# Patient Record
Sex: Male | Born: 1955 | Race: Black or African American | Hispanic: No | Marital: Single | Smoking: Former smoker
Health system: Southern US, Community
[De-identification: ages and names within clinical notes are randomized; demographics above are authoritative.]

## PROBLEM LIST (undated history)

## (undated) DIAGNOSIS — E785 Hyperlipidemia, unspecified: Secondary | ICD-10-CM

## (undated) DIAGNOSIS — R51 Headache: Secondary | ICD-10-CM

## (undated) DIAGNOSIS — I10 Essential (primary) hypertension: Secondary | ICD-10-CM

## (undated) DIAGNOSIS — F329 Major depressive disorder, single episode, unspecified: Secondary | ICD-10-CM

## (undated) DIAGNOSIS — F32A Depression, unspecified: Secondary | ICD-10-CM

## (undated) DIAGNOSIS — F419 Anxiety disorder, unspecified: Secondary | ICD-10-CM

## (undated) DIAGNOSIS — K759 Inflammatory liver disease, unspecified: Secondary | ICD-10-CM

## (undated) DIAGNOSIS — R7303 Prediabetes: Secondary | ICD-10-CM

## (undated) DIAGNOSIS — A549 Gonococcal infection, unspecified: Secondary | ICD-10-CM

## (undated) HISTORY — PX: TONSILLECTOMY: SUR1361

## (undated) HISTORY — PX: SPINAL FIXATION SURGERY: SHX1055

## (undated) HISTORY — DX: Gonococcal infection, unspecified: A54.9

## (undated) HISTORY — DX: Essential (primary) hypertension: I10

## (undated) HISTORY — DX: Headache: R51

## (undated) HISTORY — DX: Hyperlipidemia, unspecified: E78.5

---
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
## 2000-05-27 ENCOUNTER — Observation Stay (HOSPITAL_COMMUNITY): Admission: EM | Admit: 2000-05-27 | Discharge: 2000-05-28 | Payer: Self-pay | Admitting: Emergency Medicine

## 2000-05-27 ENCOUNTER — Encounter: Payer: Self-pay | Admitting: Emergency Medicine

## 2000-05-27 ENCOUNTER — Encounter: Payer: Self-pay | Admitting: Internal Medicine

## 2001-06-20 ENCOUNTER — Emergency Department (HOSPITAL_COMMUNITY): Admission: EM | Admit: 2001-06-20 | Discharge: 2001-06-20 | Payer: Self-pay | Admitting: Emergency Medicine

## 2005-01-28 ENCOUNTER — Emergency Department (HOSPITAL_COMMUNITY): Admission: EM | Admit: 2005-01-28 | Discharge: 2005-01-28 | Payer: Self-pay | Admitting: Emergency Medicine

## 2005-11-04 ENCOUNTER — Ambulatory Visit: Payer: Self-pay | Admitting: Internal Medicine

## 2005-11-10 ENCOUNTER — Ambulatory Visit: Payer: Self-pay | Admitting: Internal Medicine

## 2006-01-21 ENCOUNTER — Ambulatory Visit: Payer: Self-pay | Admitting: Internal Medicine

## 2006-01-21 ENCOUNTER — Ambulatory Visit (HOSPITAL_COMMUNITY): Admission: RE | Admit: 2006-01-21 | Discharge: 2006-01-21 | Payer: Self-pay | Admitting: Internal Medicine

## 2006-02-09 ENCOUNTER — Emergency Department (HOSPITAL_COMMUNITY): Admission: EM | Admit: 2006-02-09 | Discharge: 2006-02-09 | Payer: Self-pay | Admitting: *Deleted

## 2006-04-28 ENCOUNTER — Emergency Department (HOSPITAL_COMMUNITY): Admission: EM | Admit: 2006-04-28 | Discharge: 2006-04-28 | Payer: Self-pay | Admitting: Family Medicine

## 2006-06-25 ENCOUNTER — Emergency Department (HOSPITAL_COMMUNITY): Admission: EM | Admit: 2006-06-25 | Discharge: 2006-06-25 | Payer: Self-pay | Admitting: Emergency Medicine

## 2006-09-07 ENCOUNTER — Emergency Department (HOSPITAL_COMMUNITY): Admission: EM | Admit: 2006-09-07 | Discharge: 2006-09-07 | Payer: Self-pay | Admitting: Family Medicine

## 2006-09-10 ENCOUNTER — Ambulatory Visit: Payer: Self-pay | Admitting: Hospitalist

## 2006-09-10 ENCOUNTER — Encounter (INDEPENDENT_AMBULATORY_CARE_PROVIDER_SITE_OTHER): Payer: Self-pay | Admitting: *Deleted

## 2006-09-10 ENCOUNTER — Ambulatory Visit (HOSPITAL_COMMUNITY): Admission: RE | Admit: 2006-09-10 | Discharge: 2006-09-10 | Payer: Self-pay | Admitting: Hospitalist

## 2006-09-10 ENCOUNTER — Encounter (INDEPENDENT_AMBULATORY_CARE_PROVIDER_SITE_OTHER): Payer: Self-pay | Admitting: Internal Medicine

## 2006-09-10 DIAGNOSIS — I10 Essential (primary) hypertension: Secondary | ICD-10-CM | POA: Insufficient documentation

## 2006-09-10 LAB — CONVERTED CEMR LAB
Amphetamine Screen, Ur: NEGATIVE
Barbiturate Quant, Ur: NEGATIVE
Benzodiazepines.: NEGATIVE
Bilirubin Urine: NEGATIVE
Cocaine Metabolites: NEGATIVE
Creatinine,U: 105.5 mg/dL
Hemoglobin, Urine: NEGATIVE
Ketones, ur: NEGATIVE mg/dL
Leukocytes, UA: NEGATIVE
Marijuana Metabolite: NEGATIVE
Methadone: NEGATIVE
Nitrite: NEGATIVE
Opiates: NEGATIVE
Phencyclidine (PCP): NEGATIVE
Propoxyphene: NEGATIVE
Protein, ur: NEGATIVE mg/dL
Specific Gravity, Urine: 1.014 (ref 1.005–1.03)
Urine Glucose: NEGATIVE mg/dL
Urobilinogen, UA: 1 (ref 0.0–1.0)
pH: 6.5 (ref 5.0–8.0)

## 2006-09-11 ENCOUNTER — Encounter (INDEPENDENT_AMBULATORY_CARE_PROVIDER_SITE_OTHER): Payer: Self-pay | Admitting: *Deleted

## 2006-09-11 ENCOUNTER — Ambulatory Visit: Payer: Self-pay | Admitting: Internal Medicine

## 2006-09-11 LAB — CONVERTED CEMR LAB
ALT: 27 units/L (ref 0–53)
AST: 23 units/L (ref 0–37)
Albumin: 4.4 g/dL (ref 3.5–5.2)
Alkaline Phosphatase: 56 units/L (ref 39–117)
BUN: 10 mg/dL (ref 6–23)
CO2: 29 meq/L (ref 19–32)
Calcium: 9.7 mg/dL (ref 8.4–10.5)
Chloride: 102 meq/L (ref 96–112)
Cholesterol: 141 mg/dL (ref 0–200)
Creatinine, Ser: 0.94 mg/dL (ref 0.40–1.50)
Glucose, Bld: 118 mg/dL — ABNORMAL HIGH (ref 70–99)
HDL: 36 mg/dL — ABNORMAL LOW (ref >39)
LDL Cholesterol: 72 mg/dL (ref 0–99)
Potassium: 4.7 meq/L (ref 3.5–5.3)
Sodium: 141 meq/L (ref 135–145)
Total Bilirubin: 0.7 mg/dL (ref 0.3–1.2)
Total CHOL/HDL Ratio: 3.9
Total Protein: 7.3 g/dL (ref 6.0–8.3)
Triglycerides: 167 mg/dL — ABNORMAL HIGH (ref <150)
VLDL: 33 mg/dL (ref 0–40)

## 2006-09-24 ENCOUNTER — Ambulatory Visit: Payer: Self-pay | Admitting: Internal Medicine

## 2006-09-24 ENCOUNTER — Encounter (INDEPENDENT_AMBULATORY_CARE_PROVIDER_SITE_OTHER): Payer: Self-pay | Admitting: *Deleted

## 2006-09-24 DIAGNOSIS — J301 Allergic rhinitis due to pollen: Secondary | ICD-10-CM | POA: Insufficient documentation

## 2006-09-24 DIAGNOSIS — E785 Hyperlipidemia, unspecified: Secondary | ICD-10-CM | POA: Insufficient documentation

## 2006-10-24 ENCOUNTER — Emergency Department (HOSPITAL_COMMUNITY): Admission: EM | Admit: 2006-10-24 | Discharge: 2006-10-24 | Payer: Self-pay | Admitting: Family Medicine

## 2007-01-22 ENCOUNTER — Emergency Department (HOSPITAL_COMMUNITY): Admission: EM | Admit: 2007-01-22 | Discharge: 2007-01-22 | Payer: Self-pay | Admitting: Family Medicine

## 2007-02-01 ENCOUNTER — Emergency Department (HOSPITAL_COMMUNITY): Admission: EM | Admit: 2007-02-01 | Discharge: 2007-02-01 | Payer: Self-pay | Admitting: Emergency Medicine

## 2007-07-17 ENCOUNTER — Emergency Department (HOSPITAL_COMMUNITY): Admission: EM | Admit: 2007-07-17 | Discharge: 2007-07-17 | Payer: Self-pay | Admitting: Family Medicine

## 2007-07-29 ENCOUNTER — Telehealth (INDEPENDENT_AMBULATORY_CARE_PROVIDER_SITE_OTHER): Payer: Self-pay | Admitting: *Deleted

## 2007-12-13 ENCOUNTER — Emergency Department (HOSPITAL_COMMUNITY): Admission: EM | Admit: 2007-12-13 | Discharge: 2007-12-13 | Payer: Self-pay | Admitting: Family Medicine

## 2008-02-09 ENCOUNTER — Ambulatory Visit: Payer: Self-pay | Admitting: Internal Medicine

## 2008-02-09 ENCOUNTER — Encounter (INDEPENDENT_AMBULATORY_CARE_PROVIDER_SITE_OTHER): Payer: Self-pay | Admitting: Internal Medicine

## 2008-02-09 LAB — CONVERTED CEMR LAB
ALT: 28 units/L (ref 0–53)
AST: 32 units/L (ref 0–37)
Albumin: 4.3 g/dL (ref 3.5–5.2)
Alkaline Phosphatase: 47 units/L (ref 39–117)
BUN: 20 mg/dL (ref 6–23)
Basophils Absolute: 0 10*3/uL (ref 0.0–0.1)
Basophils Relative: 0 % (ref 0–1)
CO2: 23 meq/L (ref 19–32)
Calcium: 8.8 mg/dL (ref 8.4–10.5)
Chloride: 101 meq/L (ref 96–112)
Creatinine, Ser: 1.18 mg/dL (ref 0.40–1.50)
Eosinophils Absolute: 0.1 10*3/uL (ref 0.0–0.7)
Eosinophils Relative: 1 % (ref 0–5)
Glucose, Bld: 140 mg/dL — ABNORMAL HIGH (ref 70–99)
HCT: 41.8 % (ref 39.0–52.0)
Hemoglobin: 15.2 g/dL (ref 13.0–17.0)
Lymphocytes Relative: 29 % (ref 12–46)
Lymphs Abs: 2.6 10*3/uL (ref 0.7–4.0)
MCHC: 36.4 g/dL — ABNORMAL HIGH (ref 30.0–36.0)
MCV: 84.1 fL (ref 78.0–100.0)
Monocytes Absolute: 0.8 10*3/uL (ref 0.1–1.0)
Monocytes Relative: 9 % (ref 3–12)
Neutro Abs: 5.3 10*3/uL (ref 1.7–7.7)
Neutrophils Relative %: 61 % (ref 43–77)
Platelets: 232 10*3/uL (ref 150–400)
Potassium: 3.6 meq/L (ref 3.5–5.3)
RBC: 4.97 M/uL (ref 4.22–5.81)
RDW: 12.1 % (ref 11.5–15.5)
Sodium: 138 meq/L (ref 135–145)
TSH: 0.726 microintl units/mL (ref 0.350–4.50)
Total Bilirubin: 1.2 mg/dL (ref 0.3–1.2)
Total Protein: 6.9 g/dL (ref 6.0–8.3)
WBC: 8.8 10*3/uL (ref 4.0–10.5)

## 2008-12-01 ENCOUNTER — Ambulatory Visit: Payer: Self-pay | Admitting: *Deleted

## 2008-12-01 ENCOUNTER — Inpatient Hospital Stay (HOSPITAL_COMMUNITY): Admission: RE | Admit: 2008-12-01 | Discharge: 2008-12-06 | Payer: Self-pay | Admitting: *Deleted

## 2008-12-01 ENCOUNTER — Emergency Department (HOSPITAL_COMMUNITY): Admission: EM | Admit: 2008-12-01 | Discharge: 2008-12-01 | Payer: Self-pay | Admitting: Emergency Medicine

## 2009-07-27 ENCOUNTER — Emergency Department (HOSPITAL_COMMUNITY): Admission: EM | Admit: 2009-07-27 | Discharge: 2009-07-27 | Payer: Self-pay | Admitting: Emergency Medicine

## 2009-08-02 ENCOUNTER — Ambulatory Visit: Payer: Self-pay | Admitting: Internal Medicine

## 2009-08-02 DIAGNOSIS — N400 Enlarged prostate without lower urinary tract symptoms: Secondary | ICD-10-CM | POA: Insufficient documentation

## 2009-08-02 DIAGNOSIS — Z87898 Personal history of other specified conditions: Secondary | ICD-10-CM | POA: Insufficient documentation

## 2009-08-02 HISTORY — DX: Benign prostatic hyperplasia without lower urinary tract symptoms: N40.0

## 2009-08-02 LAB — CONVERTED CEMR LAB
ALT: 35 units/L (ref 0–53)
AST: 41 units/L — ABNORMAL HIGH (ref 0–37)
Albumin: 4.3 g/dL (ref 3.5–5.2)
CO2: 30 meq/L (ref 19–32)
Calcium: 9.1 mg/dL (ref 8.4–10.5)
Chloride: 102 meq/L (ref 96–112)
Cholesterol: 126 mg/dL (ref 0–200)
PSA: 0.59 ng/mL (ref 0.10–4.00)
Potassium: 4.5 meq/L (ref 3.5–5.3)
Total CHOL/HDL Ratio: 2.2
Total Protein: 6.7 g/dL (ref 6.0–8.3)
Vitamin B-12: 441 pg/mL (ref 211–911)

## 2009-12-01 ENCOUNTER — Emergency Department (HOSPITAL_COMMUNITY): Admission: EM | Admit: 2009-12-01 | Discharge: 2009-12-01 | Payer: Self-pay | Admitting: Family Medicine

## 2009-12-03 ENCOUNTER — Encounter: Payer: Self-pay | Admitting: Internal Medicine

## 2009-12-03 ENCOUNTER — Telehealth: Payer: Self-pay | Admitting: Internal Medicine

## 2009-12-07 ENCOUNTER — Encounter (INDEPENDENT_AMBULATORY_CARE_PROVIDER_SITE_OTHER): Payer: Self-pay | Admitting: *Deleted

## 2009-12-12 ENCOUNTER — Ambulatory Visit: Payer: Self-pay | Admitting: Internal Medicine

## 2009-12-12 DIAGNOSIS — F1910 Other psychoactive substance abuse, uncomplicated: Secondary | ICD-10-CM | POA: Insufficient documentation

## 2010-01-04 ENCOUNTER — Encounter (INDEPENDENT_AMBULATORY_CARE_PROVIDER_SITE_OTHER): Payer: Self-pay | Admitting: *Deleted

## 2010-01-04 ENCOUNTER — Ambulatory Visit: Payer: Self-pay | Admitting: Gastroenterology

## 2010-01-07 ENCOUNTER — Encounter: Payer: Self-pay | Admitting: Internal Medicine

## 2010-01-08 ENCOUNTER — Telehealth: Payer: Self-pay | Admitting: Licensed Clinical Social Worker

## 2010-02-07 ENCOUNTER — Ambulatory Visit: Payer: Self-pay | Admitting: Internal Medicine

## 2010-02-14 ENCOUNTER — Telehealth: Payer: Self-pay | Admitting: Gastroenterology

## 2010-02-27 ENCOUNTER — Telehealth: Payer: Self-pay | Admitting: Licensed Clinical Social Worker

## 2010-05-10 ENCOUNTER — Ambulatory Visit: Payer: Self-pay | Admitting: Internal Medicine

## 2010-05-13 ENCOUNTER — Emergency Department (HOSPITAL_COMMUNITY)
Admission: EM | Admit: 2010-05-13 | Discharge: 2010-05-14 | Payer: Self-pay | Source: Home / Self Care | Admitting: Emergency Medicine

## 2010-07-09 NOTE — Miscellaneous (Signed)
Summary: [facility] Previsit/prep  Clinical Lists Changes  Medications: Added new medication of DULCOLAX 5 MG  TBEC (BISACODYL) Day before procedure take 2 at 3pm and 2 at 8pm. - Signed Added new medication of METOCLOPRAMIDE HCL 10 MG  TABS (METOCLOPRAMIDE HCL) As per prep instructions. - Signed Added new medication of MIRALAX   POWD (POLYETHYLENE GLYCOL 3350) As per prep  instructions. - Signed Rx of DULCOLAX 5 MG  TBEC (BISACODYL) Day before procedure take 2 at 3pm and 2 at 8pm.;  #4 x 0;  Signed;  Entered by: Wyona Almas RN;  Authorized by: Louis Meckel MD;  Method used: Electronically to [facility] [address] [place], [place], [place]  [address], Ph: [phone], Fax: [phone] Rx of METOCLOPRAMIDE HCL 10 MG  TABS (METOCLOPRAMIDE HCL) As per prep instructions.;  #2 x 0;  Signed;  Entered by: Wyona Almas RN;  Authorized by: Louis Meckel MD;  Method used: Electronically to [facility] [address], [place], [place], [place]  [address], Ph: [phone], Fax: [phone] Rx of MIRALAX   POWD (POLYETHYLENE GLYCOL 3350) As per prep  instructions.;  #255gm x 0;  Signed;  Entered by: Wyona Almas RN;  Authorized by: Louis Meckel MD;  Method used: Electronically to [facility] [address] [place], [place], [place]  [address], Ph: [phone], Fax: [phone] Observations: Added new observation of NKA: T (01/04/2010 8:35)    Prescriptions: MIRALAX   POWD (POLYETHYLENE GLYCOL 3350) As per prep  instructions.  #255gm x 0   Entered by:   Wyona Almas RN   Authorized by:   Louis Meckel MD   Signed by:   Wyona Almas RN on 01/04/2010   Method used:   Electronically to        [facility] (retail)       [address]       [place], [place]  [address]       Ph: [phone]       Fax: [phone]   RxID:   [id number] METOCLOPRAMIDE HCL 10 MG  TABS (METOCLOPRAMIDE HCL) As per prep  instructions.  #2 x 0   Entered by:   Wyona Almas RN   Authorized by:   Louis Meckel MD   Signed by:   Wyona Almas RN on 01/04/2010   Method used:   Electronically to        [facility] (retail)       [address]       [place], [place]  [address]       Ph: [phone]       Fax: [phone]   RxID:   [id number] DULCOLAX 5 MG  TBEC (BISACODYL) Day before procedure take 2 at 3pm and 2 at 8pm.  #4 x 0   Entered by:   Wyona Almas RN   Authorized by:   Louis Meckel MD   Signed by:   Wyona Almas RN on 01/04/2010   Method used:   Electronically to        [facility] (retail)       [address]       [place], [place]  [address]       Ph: [phone]  Fax: [phone]   RxID:   [id number]

## 2010-07-09 NOTE — Assessment & Plan Note (Signed)
Summary: ACUTE-LOSING WEIGHT/NO ENERGY/PROBLEMS URINATING/CFB   Vital Signs:  Patient profile:   55 year old male Height:      71.25 inches (180.97 cm) Weight:      149.1 pounds (67.77 kg) BMI:     20.72 Temp:     98.7 degrees F oral Pulse rate:   74 / minute BP sitting:   139 / 89  (right arm) Cuff size:   regular  Vitals Entered By: Chinita Pester RN (May 10, 2010 2:06 PM)  CC: Wants to detox off Heroin; wants to go to detox facility.   Is Patient Diabetic? No Pain Assessment Patient in pain? no      Nutritional Status BMI of 19 -24 = normal  Have you ever been in a relationship where you felt threatened, hurt or afraid?Unable to ask; someone w/pt.   Does patient need assistance? Functional Status Self care Ambulation Normal   CC:  Wants to detox off Heroin; wants to go to detox facility.  .  History of Present Illness: Patient is here to "b/c wants to be admitted for an inpatient detox program." Patient admits to "shooting" heroine on 05/09/2010. Denies any other concerns.  Depression History:      The patient denies a depressed mood most of the day and a diminished interest in his usual daily activities.         Preventive Screening-Counseling & Management  Alcohol-Tobacco     Alcohol drinks/day: 0     Smoking Status: quit     Year Quit: 1990  Caffeine-Diet-Exercise     Does Patient Exercise: yes     Type of exercise: GYM     Times/week: 2  Comments: Heroin; last time used was last night.  Allergies (verified): No Known Drug Allergies  Past History:  Past medical, surgical, family and social histories (including risk factors) reviewed for relevance to current acute and chronic problems.  Past Medical History: Reviewed history from 09/24/2006 and no changes required. Chest pain Headache Hypertension +PPD in the 1990's Remote h/o gonorrhea which was treated as per patient Hyperlipidemia     lipitor 10 (April 2008)  Past Surgical  History: Reviewed history from 09/10/2006 and no changes required. Tonsilectomy 1959  Family History: Reviewed history from 12/12/2009 and no changes required. DM no CAD no Cancer  Social History: Reviewed history from 12/12/2009 and no changes required. 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  Review of Systems  The patient denies anorexia, fever, weight loss, weight gain, vision loss, decreased hearing, hoarseness, chest pain, syncope, dyspnea on exertion, peripheral edema, prolonged cough, headaches, hemoptysis, abdominal pain, melena, hematochezia, severe indigestion/heartburn, hematuria, incontinence, genital sores, muscle weakness, suspicious skin lesions, transient blindness, difficulty walking, depression, unusual weight change, abnormal bleeding, enlarged lymph nodes, angioedema, and testicular masses.    Physical Exam  General:  Well-developed,well-nourished,in no acute distress; alert,appropriate and cooperative throughout examination Head:  Normocephalic and atraumatic without obvious abnormalities. No apparent alopecia or balding. Eyes:  vision grossly intact.   Nose:  no external deformity.   Mouth:  poor dentition and teeth missing.  Incisor #24 with severe caries and frx-ed. Neck:  supple and no masses.   Lungs:  Normal respiratory effort, chest expands symmetrically. Lungs are clear to auscultation, no crackles or wheezes. Heart:  Normal rate and regular rhythm. S1 and S2 normal without gallop, murmur, click, rub  or other extra sounds. Abdomen:  Bowel sounds positive,abdomen soft and non-tender without masses, organomegaly or hernias noted. Msk:  normal ROM, no joint tenderness, no joint swelling, no joint warmth, and no redness over joints.   Pulses:  Phalen's test negative bilaterally. Extremities:  No clubbing, cyanosis, edema, or deformity  noted with normal full range of motion of all joints.   Neurologic:  alert & oriented X3, cranial nerves II-XII intact, and gait normal.   Skin:  turgor normal, color normal, and no rashes.   Cervical Nodes:  no anterior cervical adenopathy and no posterior cervical adenopathy.   Psych:  Oriented X3, memory intact for recent and remote, normally interactive, good eye contact, not anxious appearing, and not depressed appearing.     Impression & Recommendations:  Problem # 1:  SUBSTANCE ABUSE, MULTIPLE (ICD-305.90) Assessment Unchanged  Patient is medically cleared for an inpatient detox program. Patient had a meeting with Georges Mouse. He is to be enrolled through a [facility] center.  No charge fro this OV.  Complete Medication List: 1)  Terazosin Hcl 1 Mg Caps (Terazosin hcl) .... Take 1 capsule by mouth 40 minutes before bedtime 2)  Tramadol Hcl 50 Mg Tabs (Tramadol hcl) .... Take 1 tablet by mouth every 8 hours as needed for pain. 3)  Clindamycin Hcl 150 Mg Caps (Clindamycin hcl) .... One tab by mouth two times a day for 7 days  Other Orders: No Charge Patient Arrived (NCPA0) (NCPA0)   Orders Added: 1)  Est. Patient Level III [45409] 2)  Est. Patient Level III [81191] 3)  No Charge Patient Arrived (NCPA0) [NCPA0]    Prevention & Chronic Care Immunizations   Influenza vaccine: Not documented   Influenza vaccine deferral: Refused  (02/07/2010)    Tetanus booster: Not documented   Td booster deferral: Refused  (02/07/2010)    Pneumococcal vaccine: Not documented   Pneumococcal vaccine deferral: Deferred  (08/02/2009)  Colorectal Screening   Hemoccult: Not documented    Colonoscopy: Not documented   Colonoscopy action/deferral: Deferred  (02/07/2010)  Other Screening   PSA: 0.59  (08/02/2009)   Smoking status: quit  (05/10/2010)  Lipids   Total Cholesterol: 126  (08/02/2009)   LDL: 55  (08/02/2009)   LDL Direct: Not documented   HDL: 57   (08/02/2009)   Triglycerides: 69  (08/02/2009)   Lipid panel due: 08/02/2010    SGOT (AST): 41  (08/02/2009)   SGPT (ALT): 35  (08/02/2009)   Alkaline phosphatase: 69  (08/02/2009)   Total bilirubin: 0.9  (08/02/2009)   Liver panel due: 08/02/2010  Hypertension   Last Blood Pressure: 139 / 89  (05/10/2010)   Serum creatinine: 1.03  (08/02/2009)   Serum potassium 4.5  (08/02/2009)   Basic metabolic panel due: 08/02/2010  Self-Management Support :   Personal Goals (by the next clinic visit) :      Personal blood pressure goal: 140/90  (08/02/2009)     Personal LDL goal: 100  (08/02/2009)    Hypertension self-management support: Education handout, Psychologist, forensic, Resources for patients handout  (02/07/2010)    Lipid self-management support: Written self-care plan, Education handout, Pre-printed educational material, Resources for patients handout  (02/07/2010)   Process Orders Tests Sent for requisitioning (May 10, 2010 2:28 PM):     05/10/2010: [facility] -- T-CMP with Estimated GFR [[id number]] (unsigned)     05/10/2010: [facility] -- T-CBC No Diff [[id number]] (unsigned)     05/10/2010: [facility] --  T-TSH [[id number]] (unsigned)

## 2010-07-09 NOTE — Letter (Signed)
Summary: Previsit letter  [facility]  [address] [place], [place] [address]   Phone: [phone]  Fax: [phone]       12/07/2009 MRN: [id number]  Sean Shannon [address] [place], [place]  [address]  Dear [name],  Welcome to the Gastroenterology Division at [facility].    You are scheduled to see a nurse for your pre-procedure visit on 12-31-09 at 3:30p.m. on the 3rd floor at [facility], [address]  We ask that you try to arrive at our office 15 minutes prior to your appointment time to allow for check-in.  Your nurse visit will consist of discussing your medical and surgical history, your immediate family medical history, and your medications.    Please bring a complete list of all your medications or, if you prefer, bring the medication bottles and we will list them.  We will need to be aware of both prescribed and over the counter drugs.  We will need to know exact dosage information as well.  If you are on blood thinners (Coumadin, Plavix, Aggrenox, Ticlid, etc.) please call our office today/prior to your appointment, as we need to consult with your physician about holding your medication.   Please be prepared to read and sign documents such as consent forms, a financial agreement, and acknowledgement forms.  If necessary, and with your consent, a friend or relative is welcome to sit-in on the nurse visit with you.  Please bring your insurance card so that we may make a copy of it.  If your insurance requires a referral to see a specialist, please bring your referral form from your primary care physician.  No co-pay is required for this nurse visit.     If you cannot keep your appointment, please call [phone] to cancel or reschedule prior to your appointment date.  This allows [place] the opportunity to schedule an appointment for another patient in need of care.    Thank you for choosing [facility] for your medical  needs.  We appreciate the opportunity to care for you.  Please visit [place] at our website  to learn more about our practice.                     Sincerely.                                                                                                                   The Gastroenterology Division

## 2010-07-09 NOTE — Assessment & Plan Note (Signed)
Summary: acute-er/fu-(unassigned)/cfb   Vital Signs:  Patient profile:   55 year old male Height:      71.25 inches (180.97 cm) Weight:      147.5 pounds (67.05 kg) BMI:     20.50 Temp:     98.0 degrees F (36.67 degrees C) oral Pulse rate:   60 / minute BP sitting:   119 / 77  (right arm)  Vitals Entered By: Stanton Kidney Ditzler RN (August 02, 2009 4:54 PM) Is Patient Diabetic? No Pain Assessment Patient in pain? yes     Location: fingers Intensity: 5 Type: tingling Onset of pain  past 2-3 days Nutritional Status BMI of 19 -24 = normal Nutritional Status Detail appetite good  Have you ever been in a relationship where you felt threatened, hurt or afraid?denies   Does patient need assistance? Functional Status Self care Ambulation Normal Comments Wife with pt. Hard to start to urinate past month. Dental clinic referral; and refills meds.   History of Present Illness: 55 y/o male with pmh as described in the EMR; who comes to the clinic for ER followup. Patient was seen secondary to toothache on left side. Patient was prescribed antibiotics and pain killers. Patient is still having mild pain on his tooth and is also having difficulty eating.  Patient is having difficulty urinating, he is waking up multiple times at night. Patient is also complaining of 1 week of tinglin sensation on his fingers, bilaterally.  Depression History:      The patient denies a depressed mood most of the day and a diminished interest in his usual daily activities.        The patient denies that he feels like life is not worth living, denies that he wishes that he were dead, and denies that he has thought about ending his life.         Preventive Screening-Counseling & Management  Alcohol-Tobacco     Smoking Status: quit     Year Quit: 1990  Caffeine-Diet-Exercise     Does Patient Exercise: yes     Type of exercise: GYM     Times/week: 2  Problems Prior to Update: 1)  Dental Caries   (ICD-521.00) 2)  Benign Prostatic Hypertrophy, Hx of  (ICD-V13.8) 3)  Fatigue, Acute  (ICD-780.79) 4)  Allergic Rhinitis, Seasonal  (ICD-477.0) 5)  Hyperlipidemia  (ICD-272.4) 6)  Tb Skin Test, Positive, Hx of  (ICD-795.5) 7)  Health Screening  (ICD-V70.0) 8)  Chest Pain  (ICD-786.50) 9)  Hypertension  (ICD-401.9) 10)  Headache  (ICD-784.0)  Current Problems (verified): 1)  Fatigue, Acute  (ICD-780.79) 2)  Allergic Rhinitis, Seasonal  (ICD-477.0) 3)  Hyperlipidemia  (ICD-272.4) 4)  Tb Skin Test, Positive, Hx of  (ICD-795.5) 5)  Health Screening  (ICD-V70.0) 6)  Chest Pain  (ICD-786.50) 7)  Hypertension  (ICD-401.9) 8)  Headache  (ICD-784.0)  Medications Prior to Update: 1)  Lotensin 10 Mg Tabs (Benazepril Hcl) .... Take 1 Tablet By Mouth Once A Day 2)  Lipitor 10 Mg Tabs (Atorvastatin Calcium) .... Take 1 Tablet By Mouth Once A Day 3)  Zyrtec 10 Mg Tabs (Cetirizine Hcl) .... Take 1 Tablet By Mouth Once A Day  Current Medications (verified): 1)  None  Allergies (verified): No Known Drug Allergies  Past History:  Past Medical History: Last updated: 09/24/2006 Chest pain Headache Hypertension +PPD in the 1990's Remote h/o gonorrhea which was treated as per patient Hyperlipidemia     lipitor 10 (April 2008)  Past Surgical History:  Last updated: 09/10/2006 Tonsilectomy 1959  Risk Factors: Exercise: yes (08/02/2009)  Risk Factors: Smoking Status: quit (08/02/2009)  Review of Systems  The patient denies anorexia, fever, chest pain, syncope, dyspnea on exertion, peripheral edema, prolonged cough, abdominal pain, melena, hematochezia, and severe indigestion/heartburn.    Physical Exam  General:  alert, well-developed, and well-hydrated.   Mouth:  pharynx pink and moist, poor dentition, and teeth missing.   Lungs:  Normal respiratory effort, chest expands symmetrically. Lungs are clear to auscultation, no crackles or wheezes. Heart:  Normal rate and regular  rhythm. S1 and S2 normal without gallop, murmur, click, rub or other extra sounds. Abdomen:  soft, non-tender, normal bowel sounds, no distention, no masses, and no rebound tenderness.   Extremities:  No clubbing, cyanosis, edema, or deformity noted with normal full range of motion of all joints.   Neurologic:  No cranial nerve deficits noted. Station and gait are normal. Plantar reflexes are down-going bilaterally. DTRs are symmetrical throughout. Sensory, motor and coordinative functions appear intact.   Impression & Recommendations:  Problem # 1:  DENTAL CARIES (ICD-521.00) Patient still complaining of pain, but not erythema or active infection is appreciated today during exam. Will provide prescription for pain medication (tramadol), will recommend to follow a good oral hygiene and will provide directions and information of dental clinic in [place] for further evaluation and treatment.  Problem # 2:  BENIGN PROSTATIC HYPERTROPHY, HX OF (ICD-V13.8) Patient with symptoms of BPH. Will start treatment with terazosin and will check PSA. Depending results and symptoms respone will make referral to urology for further evaluation.  Orders: T-PSA ([id number])  Problem # 3:  HYPERLIPIDEMIA (ICD-272.4) Patient has been following a low fat diet and has not been taking his lipitor. LDL level today was 55, his HDL was 57 and total cholesterol was 126. will continue just monitoring for now and will encourage him to continue lifestyle changes and low fat diet.  The following medications were removed from the medication list:    Lipitor 10 Mg Tabs (Atorvastatin calcium) .Marland Kitchen... Take 1 tablet by mouth once a day  Orders: T-Lipid Profile ([id number])  Problem # 4:  HYPERTENSION (ICD-401.9) BP is at goal w/o any medications. will removed lotensin from his medications list (patient has been 7 months w/o taking it) and will encourage him to follow a low sodium diet. Electrolytes and renal function were  completely normal.  The following medications were removed from the medication list:    Lotensin 10 Mg Tabs (Benazepril hcl) .Marland Kitchen... Take 1 tablet by mouth once a day His updated medication list for this problem includes:    Terazosin Hcl 1 Mg Caps (Terazosin hcl) .Marland Kitchen... Take 1 capsule by mouth 40 minutes before bedtime  Orders: T-Comprehensive Metabolic Panel [id number]) T-Vitamin B12 ([id number])  BP today: 119/77 Prior BP: 121/74 (02/09/2008)  Labs Reviewed: K+: 3.6 (02/09/2008) Creat: : 1.18 (02/09/2008)   Chol: 141 (09/11/2006)   HDL: 36 (09/11/2006)   LDL: 72 (09/11/2006)   TG: 167 (09/11/2006)  Problem # 5:  ALLERGIC RHINITIS, SEASONAL (ICD-477.0) Patient with mild inttermitent symptoms of allergic rhinittis. He has been using OTC claritin and reports his symptoms are well controlled. will continue monitoring him at this point and will advised to take medications as directed.    Problem # 6:  TINGLING (ICD-782.0) Patient with intermittent tinglin on his finger tips, bilaterally. 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 Today we checked B12  level and was WNL; previous recent TSH was also normal. Will advised to wear protection on his hands and will follow symptoms during next visit, is discomfort still present will start treatment with gabapentin and will make referral for nerve conduction study.  Complete Medication List: 1)  Terazosin Hcl 1 Mg Caps (Terazosin hcl) .... Take 1 capsule by mouth 40 minutes before bedtime 2)  Tramadol Hcl 50 Mg Tabs (Tramadol hcl) .... Take 1 tablet by mouth every 8 hours as needed for pain.  Other Orders: Gastroenterology Referral (GI)  Patient Instructions: 1)  Please schedule a follow-up appointment in 1 month. 2)  Take your medications as prescribed. 3)  Follow a low sodium diet. 4)  You will be called with any abnormalities in the tests scheduled or  performed today.  If you don't hear from [place] within a week from when the test was performed, you can assume that your test was normal. 5)  Wear protective gadgets in your hands while working. Prescriptions: TRAMADOL HCL 50 MG TABS (TRAMADOL HCL) Take 1 tablet by mouth every 8 hours as needed for pain.  #45 x 0   Entered and Authorized by:   Vassie Loll MD   Signed by:   Vassie Loll MD on 08/02/2009   Method used:   Print then Give to Patient   RxID:   [id number] TERAZOSIN HCL 1 MG CAPS (TERAZOSIN HCL) Take 1 capsule by mouth 40 minutes before bedtime  #31 x 2   Entered and Authorized by:   Vassie Loll MD   Signed by:   Vassie Loll MD on 08/02/2009   Method used:   Print then Give to Patient   RxID:   [id number]  Process Orders Check Orders Results:     [facility]: ABN not required for this insurance Tests Sent for requisitioning (August 11, 2009 2:38 PM):     08/02/2009: [facility] -- T-Comprehensive Metabolic Panel [[id number]] (signed)     08/02/2009: [facility] -- T-PSA [phone] (signed)     08/02/2009: [facility] -- T-Vitamin B12 [[id number]] (signed)     08/02/2009: [facility] -- T-Lipid Profile [phone] (signed)    Prevention & Chronic Care Immunizations   Influenza vaccine: Not documented   Influenza vaccine deferral: Deferred  (08/02/2009)    Tetanus booster: Not documented   Td booster deferral: Deferred  (08/02/2009)    Pneumococcal vaccine: Not documented   Pneumococcal vaccine deferral: Deferred  (08/02/2009)  Colorectal Screening   Hemoccult: Not documented    Colonoscopy: Not documented   Colonoscopy action/deferral: GI referral  (08/02/2009)  Other Screening   PSA: Not documented   PSA ordered.   Smoking status: quit  (08/02/2009)  Lipids   Total Cholesterol: 141  (09/11/2006)   LDL: 72  (09/11/2006)   LDL Direct: Not documented   HDL:  36  (09/11/2006)   Triglycerides: 167  (09/11/2006)    SGOT (AST): 32  (02/09/2008)   SGPT (ALT): 28  (02/09/2008) CMP ordered    Alkaline phosphatase: 47  (02/09/2008)   Total bilirubin: 1.2  (02/09/2008)    Lipid flowsheet reviewed?: Yes   Progress toward LDL goal: At goal  Hypertension   Last Blood Pressure: 119 / 77  (08/02/2009)   Serum creatinine: 1.18  (02/09/2008)   Serum potassium 3.6  (02/09/2008) CMP ordered     Hypertension flowsheet reviewed?: Yes   Progress toward BP goal: At goal  Self-Management Support :   Personal Goals (  by the next clinic visit) :      Personal blood pressure goal: 140/90  (08/02/2009)     Personal LDL goal: 100  (08/02/2009)    Patient will work on the following items until the next clinic visit to reach self-care goals:     Medications and monitoring: take my medicines every day, check my blood pressure  (08/02/2009)     Eating: eat more vegetables, use fresh or frozen vegetables, eat baked foods instead of fried foods, eat fruit for snacks and desserts, limit or avoid alcohol  (08/02/2009)     Activity: take a 30 minute walk every day, park at the far end of the parking lot  (08/02/2009)    Hypertension self-management support: Written self-care plan, Resources for patients handout  (08/02/2009)   Hypertension self-care plan printed.    Lipid self-management support: Written self-care plan, Resources for patients handout  (08/02/2009)   Lipid self-care plan printed.      Resource handout printed.   Nursing Instructions: GI referral for screening colonoscopy (see order)

## 2010-07-09 NOTE — Progress Notes (Signed)
Summary: Soc. Work  Engineer, building services of Call: Patient calling to find out information about [facility].  Provided information and will check on him in the morning.   Follow-up for Phone Call        Zella Ball patient's wife called to find out information about [facility].  I gave her the information and encouraged her to keep encouraging patient to get into treatment before the holidays.  Dorothe Pea  April 01, 2010 12:15 PM

## 2010-07-09 NOTE — Assessment & Plan Note (Signed)
Summary: URGENT CARE F/U/CFB   Vital Signs:  Patient profile:   55 year old male Height:      71.25 inches (180.97 cm) Weight:      143.8 pounds (65.36 kg) BMI:     19.99 Temp:     98.4 degrees F (36.89 degrees C) oral Pulse rate:   82 / minute BP sitting:   132 / 84  (right arm)  Vitals Entered By: Stanton Kidney Ditzler RN (December 12, 2009 3:26 PM) Is Patient Diabetic? No Pain Assessment Patient in pain? no      Nutritional Status BMI of 19 -24 = normal Nutritional Status Detail appetite good  Have you ever been in a relationship where you felt threatened, hurt or afraid?denies   Does patient need assistance? Functional Status Self care Ambulation Normal Comments Wife with pt. FU from Urgent Care - better. Right hand tingles past 2-3 weeks.   History of Present Illness: 1. Tingling in both hands at the tips of his fingers for 2 weeks. Worse in right hand first 2 fingers and a thumb. Never had similar simptoms in the past. No recollection how the tinginling started.Patient admits using Heroine. 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 2. Requests an assistance with a rehabilitiation program for PSA.  Depression History:      The patient denies a depressed mood most of the day and a diminished interest in his usual daily activities.         Preventive Screening-Counseling & Management  Alcohol-Tobacco     Smoking Status: quit     Year Quit: 1990  Caffeine-Diet-Exercise     Does Patient Exercise: yes     Type of exercise: GYM     Times/week: 2  Current Problems (verified): 1)  Tingling  (ICD-782.0) 2)  Dental Caries  (ICD-521.00) 3)  Benign Prostatic Hypertrophy, Hx of  (ICD-V13.8) 4)  Fatigue, Acute  (ICD-780.79) 5)  Allergic Rhinitis, Seasonal  (ICD-477.0) 6)  Hyperlipidemia  (ICD-272.4) 7)  Tb Skin Test, Positive, Hx of  (ICD-795.5) 8)  Health Screening  (ICD-V70.0) 9)  Chest Pain  (ICD-786.50) 10)  Hypertension   (ICD-401.9) 11)  Headache  (ICD-784.0)  Current Medications (verified): 1)  Terazosin Hcl 1 Mg Caps (Terazosin Hcl) .... Take 1 Capsule By Mouth 40 Minutes Before Bedtime 2)  Tramadol Hcl 50 Mg Tabs (Tramadol Hcl) .... Take 1 Tablet By Mouth Every 8 Hours As Needed For Pain.  Allergies (verified): No Known Drug Allergies  Directives (verified): 1)  Full Code   Past History:  Past Medical History: Last updated: 09/24/2006 Chest pain Headache Hypertension +PPD in the 1990's Remote h/o gonorrhea which was treated as per patient Hyperlipidemia     lipitor 10 (April 2008)  Past Surgical History: Last updated: 09/10/2006 Tonsilectomy 1959  Family History: Reviewed history and no changes required. DM no CAD no Cancer  Social History: Reviewed history and no changes required. 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  Review of Systems       per HPI  Physical Exam  General:  Well-developed,well-nourished,in no acute distress; alert,appropriate and cooperative throughout examination Head:  Normocephalic and atraumatic without obvious abnormalities. No apparent alopecia or balding. Eyes:  No corneal or conjunctival inflammation noted. EOMI. Perrla. Funduscopic exam benign, without hemorrhages, exudates or papilledema. Vision grossly normal. Ears:  External ear exam shows no significant lesions or deformities.  Otoscopic examination reveals clear canals, tympanic membranes are intact bilaterally without bulging, retraction, inflammation or discharge. Hearing is grossly normal bilaterally. Nose:  External nasal examination shows no deformity or inflammation. Nasal mucosa are pink and moist without lesions or exudates. Mouth:  no gingival abnormalities, no dental plaque, pharynx pink and moist, and teeth missing.   Neck:  No deformities, masses, or  tenderness noted. Lungs:  Normal respiratory effort, chest expands symmetrically. Lungs are clear to auscultation, no crackles or wheezes. Heart:  Normal rate and regular rhythm. S1 and S2 normal without gallop, murmur, click, rub or other extra sounds. Abdomen:  Bowel sounds positive,abdomen soft and non-tender without masses, organomegaly or hernias noted. Msk:  No deformity or scoliosis noted of thoracic or lumbar spine.   Pulses:  Phalen's test negative bilaterally. Extremities:  No clubbing, cyanosis, edema, or deformity noted with normal full range of motion of all joints.  2+ right pedal edema.   Neurologic:  No cranial nerve deficits noted. Station and gait are normal. Plantar reflexes are down-going bilaterally. DTRs are symmetrical throughout. Sensory, motor and coordinative functions appear intact. Tinnell exam negative bilaterally.   Impression & Recommendations:  Problem # 1:  TINGLING (ICD-782.0) Etiology: herone-indecued paresthesia vs mild neuritis 2/2 mild carpal tunnel syndrome. Reviewed lab tests drawn at urgent care. No signs of vitamin or electrolyte abnormality; or infection.Tinnell's and Phallen's tests neg bilaterally. Instructed to wear elastic werist braces while at work (doing contsruction); hand exercises demonstrated to the paient and encouraged to perfrom 3-4 times daily.  Problem # 2:  HYPERTENSION (ICD-401.9) Low salt diet, exercise; abstinence from ETOH and illicit drugs strongly emphasized. His updated medication list for this problem includes:    Terazosin Hcl 1 Mg Caps (Terazosin hcl) .Marland Kitchen... Take 1 capsule by mouth 40 minutes before bedtime  BP today: 132/84 Prior BP: 119/77 (08/02/2009)  Labs Reviewed: K+: 4.5 (08/02/2009) Creat: : 1.03 (08/02/2009)   Chol: 126 (08/02/2009)   HDL: 57 (08/02/2009)   LDL: 55 (08/02/2009)   TG: 69 (08/02/2009)  Problem # 3:  SUBSTANCE ABUSE, MULTIPLE (ICD-305.90) Referred to [facility] to assist with rehab program  enrollment. Patient expresses a wish to quit illicit drug use. Requested to call with any questions.  Complete Medication List: 1)  Terazosin Hcl 1 Mg Caps (Terazosin hcl) .... Take 1 capsule by mouth 40 minutes before bedtime 2)  Tramadol Hcl 50 Mg Tabs (Tramadol hcl) .... Take 1 tablet by mouth every 8 hours as needed for pain.  Patient Instructions: 1)  Please, call [facility] for an information on rehab programs. 2)  Abstain from illicit drug or alcohol use. 3)  Call with any questions. 4)  Follow up in 3-4 months or sooner.   Prevention & Chronic Care Immunizations   Influenza vaccine: Not documented   Influenza vaccine deferral: Deferred  (08/02/2009)    Tetanus booster: Not documented   Td booster deferral: Deferred  (08/02/2009)    Pneumococcal vaccine: Not documented   Pneumococcal vaccine deferral: Deferred  (08/02/2009)  Colorectal Screening   Hemoccult: Not documented    Colonoscopy: Not documented   Colonoscopy action/deferral: GI referral  (08/02/2009)  Other Screening   PSA: 0.59  (08/02/2009)   Smoking status: quit  (12/12/2009)  Lipids   Total Cholesterol: 126  (08/02/2009)   LDL: 55  (08/02/2009)   LDL Direct: Not documented   HDL: 57  (08/02/2009)   Triglycerides: 69  (08/02/2009)  SGOT (AST): 41  (08/02/2009)   SGPT (ALT): 35  (08/02/2009)   Alkaline phosphatase: 69  (08/02/2009)   Total bilirubin: 0.9  (08/02/2009)  Hypertension   Last Blood Pressure: 132 / 84  (12/12/2009)   Serum creatinine: 1.03  (08/02/2009)   Serum potassium 4.5  (08/02/2009)  Self-Management Support :   Personal Goals (by the next clinic visit) :      Personal blood pressure goal: 140/90  (08/02/2009)     Personal LDL goal: 100  (08/02/2009)    Patient will work on the following items until the next clinic visit to reach self-care goals:     Medications and monitoring: take my medicines every day, check my blood pressure, weigh myself weekly   (12/12/2009)     Eating: eat more vegetables, use fresh or frozen vegetables, eat fruit for snacks and desserts, limit or avoid alcohol  (12/12/2009)     Activity: take a 30 minute walk every day  (12/12/2009)    Hypertension self-management support: Written self-care plan, Education handout, Resources for patients handout  (12/12/2009)   Hypertension self-care plan printed.   Hypertension education handout printed    Lipid self-management support: Written self-care plan, Education handout, Resources for patients handout  (12/12/2009)   Lipid self-care plan printed.   Lipid education handout printed      Resource handout printed.

## 2010-07-09 NOTE — Assessment & Plan Note (Signed)
Summary: ACUTE-BROKEN TOOTH/PAINFUL/REQUESTING DENTAL REF/CFB/MADERA   Vital Signs:  Patient profile:   55 year old male Height:      71.25 inches (180.97 cm) Weight:      148.02 pounds (67.28 kg) BMI:     20.57 Temp:     97.4 degrees F (36.33 degrees C) oral Pulse rate:   79 / minute BP sitting:   138 / 84  (right arm)  Vitals Entered By: Angelina Ok RN (February 07, 2010 3:00 PM) CC: Depression Is Patient Diabetic? No Pain Assessment Patient in pain? yes     Location: jaw Intensity: 5 Type: aching, thobbing Onset of pain  Constant Nutritional Status BMI of 19 -24 = normal  Have you ever been in a relationship where you felt threatened, hurt or afraid?No   Does patient need assistance? Functional Status Self care Ambulation Normal Comments Referral to Dentist.  Broke his tooth has a small amt left in gum.  Broke tooth on Sunday.  Bottom lip and tongue are being scrapped on broken tooth.   CC:  Depression.  History of Present Illness: Patient reprots tooth ache x 2 days after eating pizza. Felt "that something broke off." pain is 7/10 in intensity, constant radiates down to maddible. Tylenol and Motrin did not help. Denies any other concerns.  Depression History:      The patient denies a depressed mood most of the day and a diminished interest in his usual daily activities.         Preventive Screening-Counseling & Management  Alcohol-Tobacco     Smoking Status: quit     Year Quit: 1990  Current Problems (verified): 1)  Substance Abuse, Multiple  (ICD-305.90) 2)  Tingling  (ICD-782.0) 3)  Dental Caries  (ICD-521.00) 4)  Benign Prostatic Hypertrophy, Hx of  (ICD-V13.8) 5)  Fatigue, Acute  (ICD-780.79) 6)  Allergic Rhinitis, Seasonal  (ICD-477.0) 7)  Hyperlipidemia  (ICD-272.4) 8)  Tb Skin Test, Positive, Hx of  (ICD-795.5) 9)  Health Screening  (ICD-V70.0) 10)  Chest Pain  (ICD-786.50) 11)  Hypertension  (ICD-401.9) 12)  Headache   (ICD-784.0)  Medications Prior to Update: 1)  Terazosin Hcl 1 Mg Caps (Terazosin Hcl) .... Take 1 Capsule By Mouth 40 Minutes Before Bedtime 2)  Tramadol Hcl 50 Mg Tabs (Tramadol Hcl) .... Take 1 Tablet By Mouth Every 8 Hours As Needed For Pain.  Allergies (verified): No Known Drug Allergies  Past History:  Past Medical History: Last updated: 09/24/2006 Chest pain Headache Hypertension +PPD in the 1990's Remote h/o gonorrhea which was treated as per patient Hyperlipidemia     lipitor 10 (April 2008)  Social History: Last updated: 12/12/2009 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  Review of Systems  The patient denies anorexia, fever, and weight loss.         per above:  Physical Exam  General:  Well-developed,well-nourished,in no acute distress; alert,appropriate and cooperative throughout examination Eyes:  vision grossly intact.   Nose:  no external deformity.   Mouth:  poor dentition and teeth missing.  Incisor #24 with severe caries and frx-ed. Neck:  supple and no masses.   Lungs:  Normal respiratory effort, chest expands symmetrically. Lungs are clear to auscultation, no crackles or wheezes. Heart:  Normal rate and regular rhythm. S1 and S2 normal without gallop, murmur, click, rub or other extra sounds. Abdomen:  Bowel  sounds positive,abdomen soft and non-tender without masses, organomegaly or hernias noted. Msk:  normal ROM, no joint tenderness, no joint swelling, no joint warmth, and no redness over joints.   Extremities:  No clubbing, cyanosis, edema, or deformity noted with normal full range of motion of all joints.   Neurologic:  alert & oriented X3, cranial nerves II-XII intact, and gait normal.   Skin:  turgor normal, color normal, and no rashes.   Cervical Nodes:  no anterior cervical adenopathy and no posterior cervical adenopathy.    Psych:  Oriented X3, memory intact for recent and remote, normally interactive, good eye contact, not anxious appearing, and not depressed appearing.     Impression & Recommendations:  Problem # 1:  DENTAL CARIES (ICD-521.00) Assessment Deteriorated front lower incisor #24 severe caries and fracture. Most likely will need root extraction. Clindamycin 150 mg by mouth two times a day x 7 days with meals; Tramdol 50 mg by mouth qid as needed for pain.  Orders: Dental Referral (Dentist)  Problem # 2:  BENIGN PROSTATIC HYPERTROPHY, HX OF (ICD-V13.8) Assessment: Improved continues to do well with Terazosin --patient is asymptomatic.  Problem # 3:  HYPERTENSION (ICD-401.9) Controlled. No change in management. Patient is to follow up with an opthalmologist on 02/14/2010. His updated medication list for this problem includes:    Terazosin Hcl 1 Mg Caps (Terazosin hcl) .Marland Kitchen... Take 1 capsule by mouth 40 minutes before bedtime  BP today: 138/84 Prior BP: 132/84 (12/12/2009)  Labs Reviewed: K+: 4.5 (08/02/2009) Creat: : 1.03 (08/02/2009)   Chol: 126 (08/02/2009)   HDL: 57 (08/02/2009)   LDL: 55 (08/02/2009)   TG: 69 (08/02/2009)  Complete Medication List: 1)  Terazosin Hcl 1 Mg Caps (Terazosin hcl) .... Take 1 capsule by mouth 40 minutes before bedtime 2)  Tramadol Hcl 50 Mg Tabs (Tramadol hcl) .... Take 1 tablet by mouth every 8 hours as needed for pain. 3)  Clindamycin Hcl 150 Mg Caps (Clindamycin hcl) .... One tab by mouth two times a day for 7 days  Patient Instructions: 1)  Please, call Venita Sheffield if you do not hear from her by Tuesday. 2)  Please, pick up your prescription at your pharmacy. 3)  Call with any questions. 4)  Please, retunr to clinic if you change your mind and wish to have a flu shot. 5)  Please, follow up in 4 months or sooner. Prescriptions: CLINDAMYCIN HCL 150 MG CAPS (CLINDAMYCIN HCL) One tab by mouth two times a day for 7 days  #14 x 0   Entered and Authorized by:    Deatra Robinson MD   Signed by:   Deatra Robinson MD on 02/07/2010   Method used:   Electronically to        [facility] (retail)       [address]       [place], [place]  [address]       Ph: [phone]       Fax: [phone]   RxID:   [id number]    Vital Signs:  Patient profile:   55 year old male Height:      71.25 inches (180.97 cm) Weight:      148.02 pounds (67.28 kg) BMI:     20.57 Temp:     97.4 degrees F (36.33 degrees C) oral Pulse rate:   79 / minute BP sitting:   138 / 84  (right arm)  Vitals Entered By:  Angelina Ok RN (February 07, 2010 3:00 PM)   Prevention & Chronic Care Immunizations   Influenza vaccine: Not documented   Influenza vaccine deferral: Refused  (02/07/2010)    Tetanus booster: Not documented   Td booster deferral: Refused  (02/07/2010)    Pneumococcal vaccine: Not documented   Pneumococcal vaccine deferral: Deferred  (08/02/2009)  Colorectal Screening   Hemoccult: Not documented    Colonoscopy: Not documented   Colonoscopy action/deferral: Deferred  (02/07/2010)  Other Screening   PSA: 0.59  (08/02/2009)   Smoking status: quit  (02/07/2010)  Lipids   Total Cholesterol: 126  (08/02/2009)   LDL: 55  (08/02/2009)   LDL Direct: Not documented   HDL: 57  (08/02/2009)   Triglycerides: 69  (08/02/2009)   Lipid panel due: 08/02/2010    SGOT (AST): 41  (08/02/2009)   SGPT (ALT): 35  (08/02/2009)   Alkaline phosphatase: 69  (08/02/2009)   Total bilirubin: 0.9  (08/02/2009)   Liver panel due: 08/02/2010    Lipid flowsheet reviewed?: Yes   Progress toward LDL goal: Unchanged  Hypertension   Last Blood Pressure: 138 / 84  (02/07/2010)   Serum creatinine: 1.03  (08/02/2009)   Serum potassium 4.5  (08/02/2009)   Basic metabolic panel due: 08/02/2010    Hypertension flowsheet reviewed?: Yes   Progress toward BP goal: Unchanged  Self-Management Support :   Personal Goals (by the next  clinic visit) :      Personal blood pressure goal: 140/90  (08/02/2009)     Personal LDL goal: 100  (08/02/2009)    Patient will work on the following items until the next clinic visit to reach self-care goals:     Medications and monitoring: take my medicines every day, bring all of my medications to every visit  (02/07/2010)     Eating: drink diet soda or water instead of juice or soda, eat more vegetables, use fresh or frozen vegetables, eat foods that are low in salt, eat baked foods instead of fried foods, eat fruit for snacks and desserts, limit or avoid alcohol  (02/07/2010)     Activity: take a 30 minute walk every day  (02/07/2010)    Hypertension self-management support: Education handout, Psychologist, forensic, Resources for patients handout  (02/07/2010)   Hypertension education handout printed    Lipid self-management support: Written self-care plan, Education handout, Pre-printed educational material, Resources for patients handout  (02/07/2010)   Lipid self-care plan printed.   Lipid education handout printed      Resource handout printed.     Appended Document: ACUTE-BROKEN TOOTH/PAINFUL/REQUESTING DENTAL REF/CFB/MADERA Call to Angie at [facility] to see if pt could be given an Urgent appointment.  Message left to call the Clinics.Angelina Ok, RN  February 07, 2010. 3:35  PM

## 2010-07-09 NOTE — Letter (Signed)
Summary: [facility] Instructions  [facility]  [address] [place], [place] [address]   Phone: [phone]  Fax: [phone]       Sean Shannon    06-28-1955    MRN: [id number]       Procedure Day Dorna Bloom:  Farrell Ours  02/15/10     Arrival Time: 1:00PM     Procedure Time: 2:00PM     Location of Procedure:                    Juliann Pares _  [facility] (4th Floor)  PREPARATION FOR COLONOSCOPY WITH MIRALAX  Starting 5 days prior to your procedure 02/10/10 do not eat nuts, seeds, popcorn, corn, beans, peas,  salads, or any raw vegetables.  Do not take any fiber supplements (e.g. Metamucil, Citrucel, and Benefiber). ____________________________________________________________________________________________________   THE DAY BEFORE YOUR PROCEDURE         DATE: 02/14/10  DAY: THURSDAY  1   Drink clear liquids the entire day-NO SOLID FOOD  2   Do not drink anything colored red or purple.  Avoid juices with pulp.  No orange juice.  3   Drink at least 64 oz. (8 glasses) of fluid/clear liquids during the day to prevent dehydration and help the prep work efficiently.  CLEAR LIQUIDS INCLUDE: Water Jello Ice Popsicles Tea (sugar ok, no milk/cream) Powdered fruit flavored drinks Coffee (sugar ok, no milk/cream) Gatorade Juice: apple, white grape, white cranberry  Lemonade Clear bullion, consomm, broth Carbonated beverages (any kind) Strained chicken noodle soup Hard Candy THE MORNING OF YOUR PPROCEDURE FRIDAY 02/15/10 4   Mix the entire bottle of Miralax with 64 oz. of Gatorade/Powerade  Thursday evening and put in the refrigerator to chill.  5   At 5:00AM  FRIDAY MORNING take 2 Dulcolax/Bisacodyl tablets.  6   At 6:30AM FRIDAY MORNING take one Reglan/Metoclopramide tablet.  7  Starting at 7:00AM drink one 8 oz glass of the Miralax mixture every 15-20 minutes until you have finished drinking the entire 64 oz.  You should finish drinking prep around 7:30 or 8:00 pm.  8    If you are nauseated, you may take the 2nd Reglan/Metoclopramide tablet at 8:30AM.        9    At 10:00AM take 2 more DULCOLAX/Bisacodyl tablets.       You may drink clear liquids until 12:00PM  (2 HOURS BEFORE PROCEDURE).   MEDICATION INSTRUCTIONS  Unless otherwise instructed, you should take regular prescription medications with a small sip of water as early as possible the morning of your procedure.          OTHER INSTRUCTIONS  You will need a responsible adult at least 57 years of age to accompany you and drive you home.   This person must remain in the waiting room during your procedure.  Wear loose fitting clothing that is easily removed.  Leave jewelry and other valuables at home.  However, you may wish to bring a book to read or an iPod/MP3 player to listen to music as you wait for your procedure to start.  Remove all body piercing jewelry and leave at home.  Total time from sign-in until discharge is approximately 2-3 hours.  You should go home directly after your procedure and rest.  You can resume normal activities the day after your procedure.  The day of your procedure you should not:   Drive   Make legal decisions   Operate machinery   Drink  alcohol   Return to work  You will receive specific instructions about eating, activities and medications before you leave.   The above instructions have been reviewed and explained to me by   Wyona Almas RN  January 04, 2010 9:10 AM     I fully understand and can verbalize these instructions _____________________________ Date _______

## 2010-07-09 NOTE — Consult Note (Signed)
Summary: [facility]   Imported By: Margie Billet 12/07/2009 10:13:30  _____________________________________________________________________  External Attachment:    Type:   Image     Comment:   External Document

## 2010-07-09 NOTE — Progress Notes (Signed)
Summary: phone//gg  Phone Note Call from Patient   Caller: Patient Summary of Call: Pt called in for test results that were done at [facility]  He said they would be sent to [place]. Pt informed we have not received these results yet.  When we do we will call if anything is abnormal. Initial call taken by: Merrie Roof RN,  December 03, 2009 10:42 AM  Follow-up for Phone Call        Agree, please make sure we request records and follow on his results.     Appended Document: phone//gg I have called and asked for records

## 2010-07-09 NOTE — Progress Notes (Signed)
  Phone Note Outgoing Call   Call placed to: Patient Summary of Call: Left message for patient to call soc. work.    [phone]  or [phone]  Follow-up for Phone Call        Wife left message for me today to call Johnthomas at [phone]  Dorothe Pea  January 22, 2010 9:32 AM

## 2010-07-09 NOTE — Miscellaneous (Signed)
Summary: Orders Update  Clinical Lists Changes  Orders: Added new Referral order of Social Work Referral (Social ) - Signed 

## 2010-07-09 NOTE — Progress Notes (Signed)
Summary: cx proc  Phone Note Call from Patient Call back at Home Phone [phone]   Caller: Spouse Call For: Dr Arlyce Dice Summary of Call: Wife cx patients procedure for tomorrow because he has to have emergency surgery on his tooth, will cb to reschedule when ready.  will you charge? Initial call taken by: Tawni Levy,  February 14, 2010 4:49 PM  Follow-up for Phone Call        no Follow-up by: Louis Meckel MD,  February 15, 2010 10:38 AM

## 2010-08-19 LAB — DIFFERENTIAL
Eosinophils Absolute: 0.1 10*3/uL (ref 0.0–0.7)
Lymphs Abs: 2.4 10*3/uL (ref 0.7–4.0)
Neutro Abs: 5.8 10*3/uL (ref 1.7–7.7)
Neutrophils Relative %: 64 % (ref 43–77)

## 2010-08-19 LAB — ETHANOL: Alcohol, Ethyl (B): 6 mg/dL (ref 0–10)

## 2010-08-19 LAB — CBC
Hemoglobin: 15.6 g/dL (ref 13.0–17.0)
MCHC: 35.1 g/dL (ref 30.0–36.0)
Platelets: 266 10*3/uL (ref 150–400)
RBC: 5.23 MIL/uL (ref 4.22–5.81)

## 2010-08-19 LAB — RAPID URINE DRUG SCREEN, HOSP PERFORMED
Barbiturates: NOT DETECTED
Cocaine: POSITIVE — AB
Opiates: POSITIVE — AB

## 2010-08-19 LAB — BASIC METABOLIC PANEL
BUN: 6 mg/dL (ref 6–23)
CO2: 32 mEq/L (ref 19–32)
Chloride: 103 mEq/L (ref 96–112)
GFR calc non Af Amer: >60 mL/min (ref >60)
Potassium: 4.3 mEq/L (ref 3.5–5.1)

## 2010-08-21 ENCOUNTER — Emergency Department (HOSPITAL_COMMUNITY): Payer: Self-pay

## 2010-08-21 ENCOUNTER — Emergency Department (HOSPITAL_COMMUNITY)
Admission: EM | Admit: 2010-08-21 | Discharge: 2010-08-21 | Disposition: A | Discharge status: Home / Self Care | Payer: Self-pay | Attending: Emergency Medicine | Admitting: Emergency Medicine

## 2010-08-21 DIAGNOSIS — M25539 Pain in unspecified wrist: Secondary | ICD-10-CM | POA: Insufficient documentation

## 2010-08-21 DIAGNOSIS — M7989 Other specified soft tissue disorders: Secondary | ICD-10-CM | POA: Insufficient documentation

## 2010-08-21 DIAGNOSIS — I10 Essential (primary) hypertension: Secondary | ICD-10-CM | POA: Insufficient documentation

## 2010-08-25 LAB — COMPREHENSIVE METABOLIC PANEL
AST: 28 U/L (ref 0–37)
Albumin: 3.8 g/dL (ref 3.5–5.2)
BUN: 7 mg/dL (ref 6–23)
Creatinine, Ser: 0.96 mg/dL (ref 0.4–1.5)
GFR calc Af Amer: >60 mL/min (ref >60)
Potassium: 4 mEq/L (ref 3.5–5.1)
Total Protein: 6.6 g/dL (ref 6.0–8.3)

## 2010-08-25 LAB — RPR: RPR Ser Ql: NONREACTIVE

## 2010-08-25 LAB — VITAMIN B12: Vitamin B-12: 474 pg/mL (ref 211–911)

## 2010-08-25 LAB — HEPATITIS B SURFACE ANTIGEN: Hepatitis B Surface Ag: NEGATIVE

## 2010-08-25 LAB — FOLATE: Folate: >20 ng/mL

## 2010-08-25 LAB — ANA: Anti Nuclear Antibody(ANA): NEGATIVE

## 2010-08-25 LAB — RHEUMATOID FACTOR: Rhuematoid fact SerPl-aCnc: <20 IU/mL (ref 0–20)

## 2010-08-27 ENCOUNTER — Emergency Department (HOSPITAL_COMMUNITY)
Admission: EM | Admit: 2010-08-27 | Discharge: 2010-08-27 | Disposition: A | Discharge status: Home / Self Care | Payer: Self-pay | Attending: Emergency Medicine | Admitting: Emergency Medicine

## 2010-08-27 ENCOUNTER — Encounter (HOSPITAL_COMMUNITY): Payer: Self-pay

## 2010-08-27 ENCOUNTER — Emergency Department (HOSPITAL_COMMUNITY): Payer: Self-pay

## 2010-08-27 DIAGNOSIS — R51 Headache: Secondary | ICD-10-CM | POA: Insufficient documentation

## 2010-08-27 DIAGNOSIS — I10 Essential (primary) hypertension: Secondary | ICD-10-CM | POA: Insufficient documentation

## 2010-09-03 ENCOUNTER — Ambulatory Visit: Payer: Self-pay | Admitting: Internal Medicine

## 2010-09-16 LAB — CBC
HCT: 42.5 % (ref 39.0–52.0)
Hemoglobin: 14.2 g/dL (ref 13.0–17.0)
MCV: 89.6 fL (ref 78.0–100.0)
RBC: 4.75 MIL/uL (ref 4.22–5.81)
WBC: 10.3 10*3/uL (ref 4.0–10.5)

## 2010-09-16 LAB — BASIC METABOLIC PANEL
Chloride: 99 mEq/L (ref 96–112)
GFR calc Af Amer: >60 mL/min (ref >60)
Potassium: 4.6 mEq/L (ref 3.5–5.1)
Sodium: 137 mEq/L (ref 135–145)

## 2010-09-16 LAB — DIFFERENTIAL
Eosinophils Absolute: 0.2 10*3/uL (ref 0.0–0.7)
Eosinophils Relative: 2 % (ref 0–5)
Lymphs Abs: 2 10*3/uL (ref 0.7–4.0)
Monocytes Relative: 11 % (ref 3–12)
Neutrophils Relative %: 68 % (ref 43–77)

## 2010-09-16 LAB — ETHANOL: Alcohol, Ethyl (B): 6 mg/dL (ref 0–10)

## 2010-10-02 ENCOUNTER — Emergency Department (HOSPITAL_COMMUNITY)
Admission: EM | Admit: 2010-10-02 | Discharge: 2010-10-03 | Disposition: A | Discharge status: Other Acute Inpatient Hospital | Payer: Self-pay | Attending: Emergency Medicine | Admitting: Emergency Medicine

## 2010-10-02 DIAGNOSIS — R45851 Suicidal ideations: Secondary | ICD-10-CM | POA: Insufficient documentation

## 2010-10-02 DIAGNOSIS — F1910 Other psychoactive substance abuse, uncomplicated: Secondary | ICD-10-CM | POA: Insufficient documentation

## 2010-10-02 LAB — COMPREHENSIVE METABOLIC PANEL
AST: 26 U/L (ref 0–37)
BUN: 7 mg/dL (ref 6–23)
CO2: 31 mEq/L (ref 19–32)
Calcium: 9 mg/dL (ref 8.4–10.5)
Creatinine, Ser: 0.88 mg/dL (ref 0.4–1.5)
GFR calc Af Amer: >60 mL/min (ref >60)
GFR calc non Af Amer: >60 mL/min (ref >60)
Glucose, Bld: 105 mg/dL — ABNORMAL HIGH (ref 70–99)

## 2010-10-02 LAB — DIFFERENTIAL
Basophils Relative: 1 % (ref 0–1)
Monocytes Absolute: 0.8 10*3/uL (ref 0.1–1.0)
Monocytes Relative: 12 % (ref 3–12)
Neutro Abs: 3.3 10*3/uL (ref 1.7–7.7)

## 2010-10-02 LAB — RAPID URINE DRUG SCREEN, HOSP PERFORMED
Barbiturates: NOT DETECTED
Cocaine: POSITIVE — AB
Opiates: POSITIVE — AB

## 2010-10-02 LAB — CBC
Hemoglobin: 14.9 g/dL (ref 13.0–17.0)
MCH: 29 pg (ref 26.0–34.0)
MCHC: 34.1 g/dL (ref 30.0–36.0)

## 2010-10-02 LAB — ETHANOL: Alcohol, Ethyl (B): <5 mg/dL (ref 0–10)

## 2010-10-03 ENCOUNTER — Inpatient Hospital Stay (HOSPITAL_COMMUNITY)
Admission: RE | Admit: 2010-10-03 | Discharge: 2010-10-07 | DRG: 897 | Disposition: A | Discharge status: Home / Self Care | Payer: Self-pay | Source: Ambulatory Visit | Attending: Psychiatry | Admitting: Psychiatry

## 2010-10-03 DIAGNOSIS — Z6379 Other stressful life events affecting family and household: Secondary | ICD-10-CM

## 2010-10-03 DIAGNOSIS — F1994 Other psychoactive substance use, unspecified with psychoactive substance-induced mood disorder: Secondary | ICD-10-CM

## 2010-10-03 DIAGNOSIS — F1920 Other psychoactive substance dependence, uncomplicated: Principal | ICD-10-CM

## 2010-10-08 NOTE — Discharge Summary (Signed)
  NAME:  Sean Shannon, Sean Shannon NO.:  [id number]  MEDICAL RECORD NO.:  [id number]           PATIENT TYPE:  I  LOCATION:  0300                          FACILITY:  [facility]  PHYSICIAN:  Anselm Jungling, MD  DATE OF BIRTH:  04-18-1956  DATE OF ADMISSION:  10/03/2010 DATE OF DISCHARGE:  10/07/2010                              DISCHARGE SUMMARY   IDENTIFYING DATA AND REASON FOR ADMISSION:  This was an inpatient psychiatric admission for Mattheu, a 55 year old African American male who was admitted with heroin and cocaine dependencies with depression and suicidal ideation.  Please refer to the admission note for further details pertaining to the symptoms, circumstances, and history that led to his hospitalization.  He was given an initial Axis I diagnosis of polysubstance dependence.  MEDICAL AND LABORATORY:  The patient was medically and physically assessed by the psychiatric nurse practitioner.  He was in good health without any active or chronic medical problems.  There were no significant medical issues during his stay.  HOSPITAL COURSE:  The patient was admitted to the adult inpatient psychiatric service.  He presented as a tall, slender, but adequately nourished and normally-developed African American male who was pleasant, polite, and cooperative.  For the first couple of days, he was quite ill with symptoms of withdrawal from heroin.  He was treated with a routine clonidine protocol which proceeded uneventfully.  He participated in therapeutic groups and activities geared towards a 12- step recovery.  He had previously been in treatment program such as [facility] and [facility] but expressed interest in another course of rehabilitative residential treatment.  He worked closely with case management towards finding such a program.  On the 5th hospital day, he was appropriate for discharge having completed his detoxification uneventfully.  The suicide risk assessment was completed  and he was rated at minimal risk.  AFTERCARE:  The patient was to follow up with the [facility] Program.  DISCHARGE MEDICATIONS:  None.  DISCHARGE DIAGNOSES:  AXIS I:  Polysubstance dependence. AXIS II:  Deferred. AXIS III:  No acute or chronic illnesses. AXIS IV:  Stressors severe. AXIS V:  Global Assessment of Functioning on discharge 45.     Anselm Jungling, MD     SPB/MEDQ  D:  10/07/2010  T:  10/07/2010  Job:  [id number]  Electronically Signed by Geralyn Flash MD on 10/08/2010 10:33:06 AM

## 2010-10-08 NOTE — H&P (Signed)
NAME:  Sean Shannon, Sean Shannon NO.:  [id number]  MEDICAL RECORD NO.:  [id number]           PATIENT TYPE:  I  LOCATION:  0300                          FACILITY:  [facility]  PHYSICIAN:  Anselm Jungling, MD  DATE OF BIRTH:  March 30, 1956  DATE OF ADMISSION:  10/03/2010 DATE OF DISCHARGE:                      PSYCHIATRIC ADMISSION ASSESSMENT   IDENTIFYING INFORMATION:  A 55 year old African American male.  This is a voluntary admission.  HISTORY OF PRESENT ILLNESS:  Third [facility] admission for Sean Shannon, a pleasant but depressed-appearing 55 year old who reports he has been using 1-2 bags of heroin daily for a year, snorting it and using half a gram of cocaine occasionally.  He denies other substance abuse.  He has admitted to some suicidal thoughts without a specific plan and attributes his depression to his substance abuse habit.  He denies any homicidal thoughts and is requesting detox.  PAST PSYCHIATRIC HISTORY:  Previous admission to [facility] in 2010, also for snorting of cocaine and heroin.  First admission for substance abuse in 2001 to our medical unit after he was found unconscious at a party and had an aspiration event and acute renal insufficiency after what he described as a one-time use of cocaine and other substances at a party. He has a history of previous treatment at [facility] and [facility] and no known previous history of suicide attempts.  SOCIAL HISTORY:  This is a married, African American male with no legal problems.  FAMILY HISTORY:  Sister with history of substance abuse.  MEDICAL HISTORY:  Primary care practitioner is unknown.  Medical problems are no known chronic medical problems.  CURRENT MEDICATIONS:  None.  DRUG ALLERGIES:  None.  PHYSICAL EXAM:  Exam was done in the emergency room and is noted in the record.  This is a healthy, slim-built African American male, missing some teeth but, generally, he is in no distress but is complaining of some mild GI symptoms,  primarily abdominal cramping without diarrhea. He is 5 feet 10 inches tall and weighs 66 kg.  CBC is normal with a hemoglobin of 14.9, WBC 6.9, MCV 85.  Chemistry reveals normal electrolytes, BUN 7, creatinine 0.88, normal liver enzymes.  Urine drug screen positive for cocaine, opiates and alcohol screen negative.  MENTAL STATUS EXAM:  Fully alert male, pleasant cooperative with good eye contact.  He is in full contact with reality.  Blunted affect, somewhat depressed mood.  Thinking is logical.  Gives a coherent history.  Insight adequate.  Impulse control and judgment are normal. Fund of knowledge is adequate.  Remote and working memory is intact.  He is fully oriented, cooperative and asking for help with getting detoxed so he can move on to outpatient treatment.  AXIS I:  Opiate dependence.  Cocaine abuse.  Rule out substance-induced mood disorder. AXIS II:  No diagnosis. AXIS III:  No diagnosis. AXIS IV:  Significant issues with burden of illness. AXIS V:  Current 48, past year not known. PLAN:  The plan is to voluntarily admit him with the plan of a safe detox in 5 days and we have started him on a clonidine detox protocol for opiate dependence  and he is on our Detox Unit.     Margaret A. Lorin Picket, N.P.   ______________________________ Anselm Jungling, MD    MAS/MEDQ  D:  10/03/2010  T:  10/03/2010  Job:  [id number]  Electronically Signed by Kari Baars N.P. on 10/07/2010 02:59:56 PM Electronically Signed by Geralyn Flash MD on 10/08/2010 10:33:04 AM

## 2010-10-08 NOTE — H&P (Signed)
  NAME:  Sean Shannon, DENTREMONT NO.:  [id number]  MEDICAL RECORD NO.:  [id number]           PATIENT TYPE:  I  LOCATION:  0300                          FACILITY:  BH  PHYSICIAN:  Anselm Jungling, MD  DATE OF BIRTH:  04/07/1956  DATE OF ADMISSION:  10/03/2010 DATE OF DISCHARGE:                      PSYCHIATRIC ADMISSION ASSESSMENT   IDENTIFYING INFORMATION:  This is a 55 year old male.  This is a voluntary admission.  HISTORY OF THE PRESENT ILLNESS:  Third [facility] admission for Excell, a 78- year-old with a history of opiate and cocaine abuse.  He reports he has been using 1 to 2 bags of heroin daily for about a year and uses about half a gram of cocaine periodically.  Denies abuse of alcohol or other substances.  He has reported some suicidal thoughts, feeling very depressed about his addiction.  He has already made arrangements to be admitted to [facility] after he is finished being detoxed.  He denies any homicidal thoughts and there is no specific plan for suicide.  He has been snorting heroin.  PAST PSYCHIATRIC HISTORY:  This is the 3rd [facility] admission with his 1st admission for substance abuse being in 2001 to our medical unit after he was found unconscious at a party, had an aspiration event, and acute renal insufficiency.  Dictation ended at this point.     Margaret A. Lorin Picket, N.P.   ______________________________ Anselm Jungling, MD    MAS/MEDQ  D:  10/03/2010  T:  10/03/2010  Job:  [id number]  Electronically Signed by Kari Baars N.P. on 10/07/2010 02:59:52 PM Electronically Signed by Geralyn Flash MD on 10/08/2010 10:33:02 AM

## 2010-10-22 NOTE — H&P (Signed)
NAME:  Sean Shannon, SCHLARB NO.:  [id number]   MEDICAL RECORD NO.:  [id number]          PATIENT TYPE:  IPS   LOCATION:  0306                          FACILITY:  BH   PHYSICIAN:  Jasmine Pang, M.D. DATE OF BIRTH:  08-28-1955   DATE OF ADMISSION:  12/01/2008  DATE OF DISCHARGE:                       PSYCHIATRIC ADMISSION ASSESSMENT   This is a 55 year old male voluntarily admitted on December 01, 2008.   The patient is here for substance abuse.  He has been snorting heroin  and cocaine.  He began using heroin about 3 months ago as an experiment  and was unable to stop.  He denies any suicidal thoughts.  No psychosis.  Denies any alcohol use.  He is here to get help with his substance use.   PAST PSYCHIATRIC HISTORY:  First admission to the [facility].  No other psychiatric admissions noted.  No current outpatient  mental health treatment.  The patient is married and has four children.  He is currently unemployed.  No apparent legal issues.   FAMILY HISTORY:  None.   ALCOHOL AND DRUG HISTORY:  As above.   PRIMARY CARE Emylee Decelle:  None.   MEDICAL PROBLEMS:  The patient denies any acute or chronic health  issues.   MEDICATIONS:  None prior to arrival.   DRUG ALLERGIES:  No known allergies.   PHYSICAL EXAMINATION:  The patient is a middle-aged male who was fully  assessed at [facility] emergency department.  No significant findings.  He appears well-nourished, slender, in no acute distress.   LABORATORY DATA:  CBC within normal limits.  Alcohol level of 6.  Urine  drug screen is positive for cocaine, positive for opiates.  CBC within  normal limits.   MENTAL STATUS EXAMINATION:  The patient is still sedated and drowsy but  able to provide minimal information.  He again denies any suicidal  thoughts and is wanting help.   IMPRESSION:  AXIS I:  Polysubstance abuse, rule out opiate dependence.  AXIS II:  Deferred.  AXIS III:  No known medical  conditions.  AXIS IV:  Occupation.  Other psychosocial problems related to substance  use.  AXIS V:  Current is 40-45.   PLAN:  Our plan is to detox the patient with the clonidine protocol,  work on relapse prevention.  Will continue to assess comorbidities,  identify his support.  Case manager will assess his rehabilitation  program available to patient.  He will be in the red group.  His  tentative length of stay at this time is 3-5 days.      Landry Corporal, N.P.      Jasmine Pang, M.D.  Electronically Signed    JO/MEDQ  D:  12/05/2008  T:  12/05/2008  Job:  [id number]

## 2010-10-25 NOTE — Discharge Summary (Signed)
NAME:  MARQUIES, WANAT NO.:  [id number]   MEDICAL RECORD NO.:  [id number]          PATIENT TYPE:  IPS   LOCATION:  0306                          FACILITY:  BH   PHYSICIAN:  Jasmine Pang, M.D. DATE OF BIRTH:  1955-11-04   DATE OF ADMISSION:  12/01/2008  DATE OF DISCHARGE:  12/06/2008                               DISCHARGE SUMMARY   IDENTIFICATION:  This is a 55 year old male, who was admitted on December 01, 2008.   HISTORY OF PRESENT ILLNESS:  The patient is here for substance abuse.  He has been snorting cocaine and heroin.  He has been using heroin about  3 months ago as an experiment.  He began using heroin about 3 months ago  as an experiment and was unable to stop.  He denies any suicidal  thoughts.  There is no psychosis.  He denies any alcohol use.  He is  here to get help with his substance use.  This is the first admission to  [facility].  No other psychiatric admissions noted.  No  current outpatient mental health treatment.  The patient is married and  has 4 children.  He is currently unemployed.  He has no legal issues.  Family history is unremarkable.  For further admission information, see  psychiatric admission assessment.  Initially, on Axis I, diagnosis of  polysubstance abuse, rule out opiate dependence was given.  On Axis III,  there were no medical conditions noted.   PHYSICAL FINDINGS:  There were no acute physical or medical problems  noted.  He was fully assessed at [facility] emergency department.   DIAGNOSTIC STUDIES:  CBC was within normal limits.  Alcohol level was 6.  Urine drug screen was positive for cocaine and positive for opiates.   HOSPITAL COURSE:  Upon admission, the patient was started on Ambien 10  mg p.o. q.h.s. for insomnia and clonidine detox protocol.  He tolerated  these medications well with no significant side effects.  In initial  sessions, the patient was coherent, but sometimes slow.  He was  alert  and oriented x3.  Insight and judgment were poor.  He was irritable with  poor eye contact, somewhat sedated.  He denied any suicidal or homicidal  ideation.  There was no positive tremor.  There was no psychosis.  As  hospitalization progressed, he tolerated withdrawal symptoms well.  He  still remained depressed and anxious on December 04, 2008.  Affect was  tearful.  He was having some middle of the night awakening and the  patient appeared to have minimal insight.  He continued to progress as  discharge approached.  He was going to be able to return home to his  wife.  He was going to follow up at the [facility], and he also has  a [employer] card.  He was going to also follow up  with [facility] [place] for counseling if he so desire and  wife was eager for the patient to come home.  On December 06, 2008, mental  status had improved  from admission status.  The patient was less  depressed, less anxious.  He was alert and oriented.  Psychomotor  activity was within normal limits.  Speech was normal rate and flow.  The mood was less depressed, less anxious.  Affect was consistent with  mood.  There was no suicidal or homicidal ideation.  No auditory or  visual hallucinations.  No paranoia or delusions.  Thoughts were logical  and goal-directed.  Thought content, no predominant theme.  Cognitive  was grossly intact.  Insight fair.  Judgment fair.  Impulse control  fair.  It was felt the patient was safe to be discharged.   DISCHARGE DIAGNOSES:  Axis I:  Polysubstance dependence.  Axis II:  None.  Axis III:  None.  Axis IV:  Severe (occupation, other psychosocial problems related to  substance use, problems with primary support group).  Axis V:  Global assessment of functioning was 50 upon discharge.  GAF  was 40 upon admission.  GAF highest past year was 60-65.   DISCHARGE/PLANS:  There were no specific activity level or dietary   restrictions.   POST HOSPITAL CARE PLANS:  The patient will go to NA meetings in his  current schedules, 90 meetings in 90 days is recommended.   DISCHARGE MEDICATIONS:  None.      Jasmine Pang, M.D.  Electronically Signed     BHS/MEDQ  D:  12/18/2008  T:  12/19/2008  Job:  [id number]

## 2010-10-25 NOTE — Discharge Summary (Signed)
[facility]  Patient:    Sean Shannon, Sean Shannon                    MRN: [id number] Adm. Date:  [id number] Disc. Date: [id number] Attending:  Selina Cooley CC:         Penni Homans.   Discharge Summary  DISCHARGE DIAGNOSES: 1. Polysubstance intoxication with alcohol level of 134.  Toxicology screen    positive for cocaine and opiates. 2. Aspiration event with transient desaturation and basilar infiltrates    bilaterally. 3. Dehydration. 4. Acute renal insufficiency secondary to dehydration. 5. Alcohol use, possible abuse. 6. Polysubstance.  The patient denies dependence.  DISCHARGE MEDICATIONS: 1. Albuterol one to two puffs q.4h. p.r.n. 2. Augmentin 875 mg p.o. b.i.d. x 9 additional days.  CONDITION ON DISCHARGE:  The patient is alert and oriented and in no acute distress, saturating 94-95% on room air.  Stable vital signs.  Afebrile  REASON FOR ADMISSION:  The patient is a 55 year old African-American male with no significant past medical history, who was at a party and was found unconscious by his friends.  Paramedics were called and noted that the patient was unconscious with pinpoint pupils.  He had a slight increase in responsiveness after 2 mg of Narcan were administered.  HOSPITAL COURSE: #1 - POLYSUBSTANCE INTOXICATION AS NOTED BY POSITIVE ALCOHOL LEVEL OF 134 AND EVIDENCE OF COCAINE AND OPIATES ON HIS TOXICOLOGY SCREEN:  The patient required a couple of doses of Narcan, however, he did not require any respiratory support.  The patient denied any intention to harm himself or to overdose on drugs.  He noted that he is not a chronic drug user and that this was a single episode amongst friends.  #2 - POLYSUBSTANCE USE:  The patient denied any polysubstance dependence and deferred any resources regarding rehabilitation.  #3 - ALCOHOL USE UP TO SIX BEERS A DAY:  The patient denies alcohol dependence, however, appears to be at risk  for alcohol abuse if not hazardous drinking.  #4 - ASPIRATION WITH TRANSIENT DESATURATION INTO THE 70s AND A CHEST X-RAY CONSISTENT WITH BILATERAL INFILTRATES, SUSPECT ASPIRATION PNEUMONITIS AND PATIENT AT REST FOR ASPIRATION PNEUMONIA:  The patient was started on Unasyn and will be discharged on Augmentin to complete a 10-day course.  He was instructed to seek out medical attention if he should have ongoing fevers, worsening shortness of breath, or cough.  #5 - ACUTE RENAL INSUFFICIENCY SECONDARY TO DEHYDRATION WITH CORRECTION AFTER INTRAVENOUS FLUIDS:  Admission creatinine of 1.4 and discharge of 1.0.  #6 - ELEVATED CPK IN THE 400 RANGE:  Resolution post IV fluid hydration. Consistent with mild rhabdomyolysis.  No evidence of ischemia per cardiac enzymes or EKG.  STUDIES DONE:  A head CT was negative for any acute changes.  EKG was normal sinus rhythm and no acute changes.  CONSULTANTS:  None. DD:  05/28/00 TD:  05/29/00 Job: [id number]

## 2010-10-29 DIAGNOSIS — R768 Other specified abnormal immunological findings in serum: Secondary | ICD-10-CM | POA: Insufficient documentation

## 2010-10-31 ENCOUNTER — Encounter: Payer: Self-pay | Admitting: Internal Medicine

## 2010-10-31 DIAGNOSIS — R51 Headache: Secondary | ICD-10-CM | POA: Insufficient documentation

## 2010-10-31 DIAGNOSIS — A549 Gonococcal infection, unspecified: Secondary | ICD-10-CM | POA: Insufficient documentation

## 2010-10-31 DIAGNOSIS — R519 Headache, unspecified: Secondary | ICD-10-CM | POA: Insufficient documentation

## 2010-10-31 DIAGNOSIS — R079 Chest pain, unspecified: Secondary | ICD-10-CM | POA: Insufficient documentation

## 2010-11-09 ENCOUNTER — Emergency Department (HOSPITAL_COMMUNITY)
Admission: EM | Admit: 2010-11-09 | Discharge: 2010-11-09 | Disposition: A | Discharge status: Home / Self Care | Payer: Self-pay | Attending: Emergency Medicine | Admitting: Emergency Medicine

## 2010-11-09 DIAGNOSIS — M549 Dorsalgia, unspecified: Secondary | ICD-10-CM | POA: Insufficient documentation

## 2010-11-09 DIAGNOSIS — I10 Essential (primary) hypertension: Secondary | ICD-10-CM | POA: Insufficient documentation

## 2010-11-09 LAB — URINALYSIS, ROUTINE W REFLEX MICROSCOPIC
Bilirubin Urine: NEGATIVE
Glucose, UA: NEGATIVE mg/dL
Ketones, ur: NEGATIVE mg/dL
pH: 5 (ref 5.0–8.0)

## 2010-11-20 ENCOUNTER — Ambulatory Visit (INDEPENDENT_AMBULATORY_CARE_PROVIDER_SITE_OTHER): Payer: Self-pay | Admitting: Internal Medicine

## 2010-11-20 ENCOUNTER — Encounter: Payer: Self-pay | Admitting: Internal Medicine

## 2010-11-20 VITALS — BP 140/81 | HR 70 | Temp 97.4°F | Ht 71.5 in | Wt 160.6 lb

## 2010-11-20 DIAGNOSIS — R768 Other specified abnormal immunological findings in serum: Secondary | ICD-10-CM

## 2010-11-20 DIAGNOSIS — Z299 Encounter for prophylactic measures, unspecified: Secondary | ICD-10-CM

## 2010-11-20 DIAGNOSIS — F1910 Other psychoactive substance abuse, uncomplicated: Secondary | ICD-10-CM

## 2010-11-20 DIAGNOSIS — R7611 Nonspecific reaction to tuberculin skin test without active tuberculosis: Secondary | ICD-10-CM

## 2010-11-20 DIAGNOSIS — I10 Essential (primary) hypertension: Secondary | ICD-10-CM

## 2010-11-20 DIAGNOSIS — Z87898 Personal history of other specified conditions: Secondary | ICD-10-CM

## 2010-11-20 DIAGNOSIS — J301 Allergic rhinitis due to pollen: Secondary | ICD-10-CM

## 2010-11-20 DIAGNOSIS — R894 Abnormal immunological findings in specimens from other organs, systems and tissues: Secondary | ICD-10-CM

## 2010-11-20 DIAGNOSIS — E785 Hyperlipidemia, unspecified: Secondary | ICD-10-CM

## 2010-11-20 LAB — PROTIME-INR
INR: 0.94 (ref <1.50)
Prothrombin Time: 12.8 seconds (ref 11.6–15.2)

## 2010-11-20 LAB — APTT: aPTT: 31 seconds (ref 24–37)

## 2010-11-20 MED ORDER — TERAZOSIN HCL 1 MG PO CAPS: 1.0000 mg | ORAL_CAPSULE | Freq: Every day | ORAL | Status: DC

## 2010-11-20 MED ORDER — TETANUS-DIPHTH-ACELL PERTUSSIS 5-2.5-18.5 LF-MCG/0.5 IM SUSP
0.5000 mL | Freq: Once | INTRAMUSCULAR | Status: AC
  Administered 2010-11-20: 0.5 mL via INTRAMUSCULAR

## 2010-11-20 NOTE — Assessment & Plan Note (Signed)
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 A chest x-ray was performed which was within normal limits. Patient did not receive any medical therapy since risk outweighs the benefit at this age. The patient was informed that she should never get a TB test done since this can aggravate a significant reaction. If patient is experiencing cough, shortness of breath and weight loss obviously a chest x-ray should be performed.

## 2010-11-20 NOTE — Assessment & Plan Note (Signed)
Patient's last lipid panel has been within normal limits. I would check lipid panel today. Patient may not have hyperlipidemia.

## 2010-11-20 NOTE — Assessment & Plan Note (Signed)
Patient has a history of BPH and was supposed to take Terazosin which has been controlling his blood pressure and his BPH symptoms. Patient has been taking it since five-month. I will restart it today.

## 2010-11-20 NOTE — Assessment & Plan Note (Signed)
Patient and has no complaints today concerning his allergies.

## 2010-11-20 NOTE — Assessment & Plan Note (Addendum)
Patient was found to have  10/29/2010 hepatitis C antibody positive. He was recommended to followup with the hepatitis C clinic on [address]. Further testing included HIV which was nonreactive. Hepatitis B surface antigen was negative in June 2011. Liver function tests have been within normal limits. No imaging studies of the abdomen was performed ever. It is required to get TSH, and PT/INR and PTT to refer the patient to the hepatitis clinic. I informed the patient that he has to be off any kind of drug abuse.

## 2010-11-20 NOTE — Progress Notes (Signed)
  Subjective:    Patient ID: Sean Shannon, male    DOB: 1956/01/24, 55 y.o.   MRN: [id number]  HPI    Review of Systems     Objective:   Physical Exam        Assessment & Plan:

## 2010-11-20 NOTE — Assessment & Plan Note (Signed)
Patient is currently is using cocaine and Heroin, last use yesterday. Patient had admission for detox in December of 2011 and April of 2012 he was also experiencing suicidal ideation. Patient was then referred to [facility]. 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 Had an episode of 13 years where he was completely clean. Reviewing the chart patient has been using cocaine since 2001 and since 2 years started to use heroin. I discussed with the patient extensively about the risk of using these drugs and didn't need to followup with the [facility] dependency clinic for further management. Patient understood and is willing to proceed with this. Patient is also aware that he needs to be off drugs to be able to receive any medical therapy for his hepatitis C.

## 2010-11-20 NOTE — Assessment & Plan Note (Signed)
Patient had been prescribed Terazosin we should have controlled his symptoms. The patient was not taking it since five-month. I will restart it today especially in the setting of elevated blood pressure.

## 2010-11-20 NOTE — Assessment & Plan Note (Signed)
#  1 received tetanus shot today #2 We'll refer the patient for colonoscopy. Never had a colonoscopy  prior to this. Will get it scheduled but patient needs to update his [facility] card.

## 2010-11-21 LAB — LIPID PANEL: LDL Cholesterol: 51 mg/dL (ref 0–99)

## 2010-12-23 ENCOUNTER — Encounter: Payer: Self-pay | Admitting: Internal Medicine

## 2011-01-08 ENCOUNTER — Telehealth: Payer: Self-pay | Admitting: *Deleted

## 2011-01-08 NOTE — Telephone Encounter (Signed)
Call to pt to inform him of the need to call the billing office at [facility] before an appointment can be scheduled.  Message left on pt's home to call the Clinics.

## 2011-01-16 ENCOUNTER — Telehealth: Payer: Self-pay | Admitting: *Deleted

## 2011-01-16 NOTE — Telephone Encounter (Signed)
Call to pt message left for pt to call the Clinics.  Referral to GI cannot be made until pt calls [facility].

## 2011-01-17 NOTE — Telephone Encounter (Signed)
Thank you very much for the information.  Sean Shannon

## 2012-08-05 ENCOUNTER — Encounter: Payer: Self-pay | Admitting: Internal Medicine

## 2012-08-10 ENCOUNTER — Ambulatory Visit (INDEPENDENT_AMBULATORY_CARE_PROVIDER_SITE_OTHER): Payer: Self-pay | Admitting: Internal Medicine

## 2012-08-10 ENCOUNTER — Ambulatory Visit: Payer: Self-pay

## 2012-08-10 ENCOUNTER — Encounter: Payer: Self-pay | Admitting: Internal Medicine

## 2012-08-10 VITALS — BP 149/92 | HR 77 | Temp 96.7°F | Resp 20 | Ht 70.0 in | Wt 173.2 lb

## 2012-08-10 DIAGNOSIS — Z87898 Personal history of other specified conditions: Secondary | ICD-10-CM

## 2012-08-10 DIAGNOSIS — F1910 Other psychoactive substance abuse, uncomplicated: Secondary | ICD-10-CM

## 2012-08-10 DIAGNOSIS — I10 Essential (primary) hypertension: Secondary | ICD-10-CM

## 2012-08-10 NOTE — Patient Instructions (Signed)
1.  Work on Ambulance person done so we can work on preventative care.  2. Follow up in 1 month.  LIFESTYLE TIPS TO HELP WITH YOUR BLOOD PRESSURE CONTROL  WEIGHT REDUCTION:  Strategies: A healthy weight loss program includes:  A calorie restricted diet based on individual calorie needs.   Increased physical activity (exercise).  An exercise program is just as important as the right low-calorie diet.    An unhealthy weight loss program includes:  Fasting.   Fad diets.   Supplements and drugs.  These choices do not succeed in long-term weight control.   Home Care Instructions: To help you make the needed dietary changes:   Exercise and perform physical activity as directed by your caregiver.   Keep a daily record of everything you eat. There are many free websites to help you with this. It may be helpful to measure your foods so you can determine if you are eating the correct portion sizes.   Use low-calorie cookbooks or take special cooking classes.   Avoid alcohol. Drink more water and drinks with no calories.   Take vitamins and supplements only as recommended by your caregiver.   Weight loss support groups, Registered Dieticians, counselors, and stress reduction education can also be very helpful.   ________________________________________________________________________  DASH DIET:  The DASH diet stands for "Dietary Approaches to Stop Hypertension." It is a healthy eating plan that has been shown to reduce high blood pressure (hypertension) in as little as 14 days, while also possibly providing other significant health benefits. These other health benefits include reducing the risk of breast cancer after menopause and reducing the risk of type 2 diabetes, heart disease, colon cancer, and stroke. Health benefits also include weight loss and slowing kidney failure in patients with chronic kidney disease.   Diet guidelines: Limit salt (sodium). Your diet should  contain less than 1500 mg of sodium daily.  Limit refined or processed carbohydrates. Your diet should include mostly whole grains. Desserts and added sugars should be used sparingly.  Include small amounts of heart-healthy fats. These types of fats include nuts, oils, and tub margarine. Limit saturated and trans fats. These fats have been shown to be harmful in the body.   Choosing Foods: The following food groups are based on a 2000 calorie diet. See your Registered Dietitian for individual calorie needs.  Grains and Grain Products (6 to 8 servings daily)  Eat More Often: Whole-wheat bread, brown rice, whole-grain or wheat pasta, quinoa, popcorn without added fat or salt (air popped).  Eat Less Often: White bread, white pasta, white rice, cornbread.  Vegetables (4 to 5 servings daily)  Eat More Often: Fresh, frozen, and canned vegetables. Vegetables may be raw, steamed, roasted, or grilled with a minimal amount of fat.  Eat Less Often/Avoid: Creamed or fried vegetables. Vegetables in a cheese sauce.  Fruit (4 to 5 servings daily)  Eat More Often: All fresh, canned (in natural juice), or frozen fruits. Dried fruits without added sugar. One hundred percent fruit juice ( cup [237 mL] daily).  Eat Less Often: Dried fruits with added sugar. Canned fruit in light or heavy syrup.  [employer], Fish, and Poultry (2 servings or less daily. One serving is 3 to 4 oz [85-114 g]).  Eat More Often: Ninety percent or leaner ground beef, tenderloin, sirloin. Round cuts of beef, chicken breast, [place] breast. All fish. Grill, bake, or broil your meat. Nothing should be fried.  Eat Less Often/Avoid: Fatty cuts of  meat, [place], or chicken leg, thigh, or wing. Fried cuts of meat or fish.  Dairy (2 to 3 servings)  Eat More Often: Low-fat or fat-free milk, low-fat plain or light yogurt, reduced-fat or part-skim cheese.  Eat Less Often/Avoid: Milk (whole, 2%, skim, or chocolate). Whole milk yogurt. Full-fat cheeses.   Nuts, Seeds, and Legumes (4 to 5 servings per week)  Eat More Often: All without added salt.  Eat Less Often/Avoid: Salted nuts and seeds, canned beans with added salt.  Fats and Sweets (limited)  Eat More Often: Vegetable oils, tub margarines without trans fats, sugar-free gelatin. Mayonnaise and salad dressings.  Eat Less Often/Avoid: Coconut oils, palm oils, butter, stick margarine, cream, half and half, cookies, candy, pie.

## 2012-08-10 NOTE — Assessment & Plan Note (Signed)
Lab Results  Component Value Date   NA 138 10/02/2010   K 4.2 10/02/2010   CL 101 10/02/2010   CO2 31 10/02/2010   BUN 7 10/02/2010   CREATININE 0.88 10/02/2010    BP Readings from Last 3 Encounters:  08/10/12 149/92  11/20/10 140/81  05/10/10 139/89    Assessment: Hypertension control:  mildly elevated  Progress toward goals:  unchanged Barriers to meeting goals:  no barriers identified  Plan: Hypertension treatment:  He is currently off medication for his blood pressure.  He is only mildly elevated at this time and we discussed diet and exercise as an option for better control other then medication at this time.  We also discussed trying these interventions for a month and rechecking his blood pressure that time and if it remains elevated considering medication at that time.  He was given information on the DASH diet as well as exercise recommendations.  He will follow up in one month.  If he needs a medication we could start with a standard medication such as HCTZ or lisinopril but with his mildly symptomatic BPH he would also be a candidate to start with a 4 dollar list alpha blocker.

## 2012-08-10 NOTE — Assessment & Plan Note (Signed)
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  He currently states that he is in a good place and is trying to take good care of himself.  We discussed preparing for possible stressors and to guard his sobriety.  He was congratulated and I encouraged him to continue his cessation.

## 2012-08-10 NOTE — Progress Notes (Signed)
Subjective:   Patient ID: Sean Shannon male   DOB: Oct 15, 1955 57 y.o.   MRN: [id number]  HPI: Sean Shannon is a 57 y.o. man who presents to clinic today for follow up on his chronic medical conditions including hypertension, BPH, and substance abuse.  He was recently released from prison after a 2 year incarceration.  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  He also states that he has quit smoking and is not drinking alcohol at this time.  He is currently not taking any medications.  He states that he is currently living with his [name] who is present during the visit today.  He does not have insurance at this time but did visit with Chauncey Reading today prior to our appointment.  They are going to work on getting insurance taken care of so we can work on his preventative care.    He states that he hasn't been taking any medication over the last 2 years.  His last visit here in June of 2012 shows that he was supposed to be on Terazosin for mild BPH symptoms as well as mild hypertension.  He states that he has noticed that his stream is not as strong as it used to be but denies any problems with nocturia, inability to initiate stream, blood in the urine, or pain with urination.  He also denies problems with headaches, chest pain, or blurry vision.    Past Medical History  Diagnosis Date  . Chest pain   . Headache   . Hypertension   . Hyperlipidemia   . Gonorrhea     remote h/o gonorrhea which was treated as per patient   Current Outpatient Prescriptions  Medication Sig Dispense Refill  . terazosin (HYTRIN) 1 MG capsule Take 1 capsule (1 mg total) by mouth at bedtime. Take 40 minutes before bedtime  30 capsule  2   No current facility-administered medications for this visit.   Family History  Problem Relation Age of Onset  . Diabetes      Family history   History   Social History   . Marital Status: Single    Spouse Name: N/A    Number of Children: N/A  . Years of Education: N/A   Occupational History  . Corporate investment banker    Social History Main Topics  . Smoking status: Former Smoker    Types: Cigarettes    Quit date: 06/09/1993  . Smokeless tobacco: Not on file  . Alcohol Use: No  . Drug Use: No     Comment: Hx of heroine for 1 year.  Quit in 11/2009  . Sexually Active: Not on file   Other Topics Concern  . Not on file   Social History Narrative   Single, no children.   [name], lives with his fiance in a house   Review of Systems: Constitutional: Denies fever, chills, diaphoresis, appetite change and fatigue.  HEENT: Denies photophobia, eye pain, redness, hearing loss, ear pain, congestion, sore throat, rhinorrhea, sneezing, mouth sores, trouble swallowing, neck pain, neck stiffness and tinnitus.   Respiratory: Denies SOB, DOE, cough, chest tightness,  and wheezing.   Cardiovascular: Denies chest pain, palpitations and leg swelling.  Gastrointestinal: Denies nausea, vomiting, abdominal pain, diarrhea, constipation, blood in stool and abdominal distention.  Genitourinary: Denies dysuria, urgency, frequency, hematuria, flank pain and difficulty urinating.  Musculoskeletal: Denies myalgias,  back pain, joint swelling, arthralgias and gait problem.  Skin: Denies pallor, rash and wound.  Neurological: Denies dizziness, seizures, syncope, weakness, light-headedness, numbness and headaches.  Hematological: Denies adenopathy. Easy bruising, personal or family bleeding history  Psychiatric/Behavioral: Denies suicidal ideation, mood changes, confusion, nervousness, sleep disturbance and agitation  Objective:  Physical Exam: Filed Vitals:   08/10/12 1422  BP: 149/92  Pulse: 77  Temp: 96.7 F (35.9 C)  TempSrc: Oral  Resp: 20  Height: 5\' 10"  (1.778 m)  Weight: 173 lb 3.2 oz (78.563 kg)  SpO2: 98%   Constitutional: Vital signs reviewed.   Patient is a well-developed and well-nourished man in no acute distress and cooperative with exam. Alert and oriented x3.  Head: Normocephalic and atraumatic Ear: TM normal bilaterally Mouth: no erythema or exudates, MMM, several missing teeth noted but gums are red and no edematous.  Eyes: PERRL, EOMI, conjunctivae normal, No scleral icterus.  Neck: Supple, Trachea midline normal ROM, No JVD, mass, thyromegaly, or carotid bruit present.  Cardiovascular: RRR, S1 normal, S2 normal, no MRG, pulses symmetric and intact bilaterally Pulmonary/Chest: CTAB, no wheezes, rales, or rhonchi Abdominal: Soft. Non-tender, non-distended, bowel sounds are normal, no masses, organomegaly, or guarding present.  GU: no CVA tenderness Musculoskeletal: No joint deformities, erythema, or stiffness, ROM full and no nontender Hematology: no cervical, inginal, or axillary adenopathy.  Neurological: A&O x3, Strength is normal and symmetric bilaterally, cranial nerve II-XII are grossly intact, no focal motor deficit, sensory intact to light touch bilaterally.  Skin: Warm, dry and intact. No rash, cyanosis, or clubbing.  Psychiatric: Normal mood and affect. speech and behavior is normal. Judgment and thought content normal. Cognition and memory are normal.   Assessment & Plan:

## 2012-08-10 NOTE — Assessment & Plan Note (Signed)
He is having very mild symptoms of BPH.  He has no family history of prostate cancer and is not having hematuria or severe symptoms so it is less likely that this is prostate cancer.  If this progresses a DRE and PSA would be indicated to work up possible prostate malignancy vs BPH as the cause of his symptoms.

## 2012-08-29 ENCOUNTER — Emergency Department (HOSPITAL_COMMUNITY)
Admission: EM | Admit: 2012-08-29 | Discharge: 2012-08-29 | Disposition: A | Discharge status: Home / Self Care | Payer: Self-pay | Attending: Emergency Medicine | Admitting: Emergency Medicine

## 2012-08-29 ENCOUNTER — Encounter (HOSPITAL_COMMUNITY): Payer: Self-pay | Admitting: *Deleted

## 2012-08-29 ENCOUNTER — Emergency Department (HOSPITAL_COMMUNITY): Payer: Self-pay

## 2012-08-29 ENCOUNTER — Encounter (HOSPITAL_COMMUNITY): Payer: Self-pay | Admitting: Emergency Medicine

## 2012-08-29 ENCOUNTER — Emergency Department (INDEPENDENT_AMBULATORY_CARE_PROVIDER_SITE_OTHER)
Admission: EM | Admit: 2012-08-29 | Discharge: 2012-08-29 | Disposition: A | Discharge status: Nursing Home (Includes ICF) | Payer: Self-pay | Source: Home / Self Care | Attending: Family Medicine | Admitting: Family Medicine

## 2012-08-29 DIAGNOSIS — M79602 Pain in left arm: Secondary | ICD-10-CM

## 2012-08-29 DIAGNOSIS — Z87891 Personal history of nicotine dependence: Secondary | ICD-10-CM | POA: Insufficient documentation

## 2012-08-29 DIAGNOSIS — IMO0001 Reserved for inherently not codable concepts without codable children: Secondary | ICD-10-CM | POA: Insufficient documentation

## 2012-08-29 DIAGNOSIS — I10 Essential (primary) hypertension: Secondary | ICD-10-CM | POA: Insufficient documentation

## 2012-08-29 DIAGNOSIS — M79609 Pain in unspecified limb: Secondary | ICD-10-CM | POA: Insufficient documentation

## 2012-08-29 DIAGNOSIS — R0789 Other chest pain: Secondary | ICD-10-CM

## 2012-08-29 DIAGNOSIS — R079 Chest pain, unspecified: Secondary | ICD-10-CM | POA: Insufficient documentation

## 2012-08-29 DIAGNOSIS — E785 Hyperlipidemia, unspecified: Secondary | ICD-10-CM | POA: Insufficient documentation

## 2012-08-29 DIAGNOSIS — Z8619 Personal history of other infectious and parasitic diseases: Secondary | ICD-10-CM | POA: Insufficient documentation

## 2012-08-29 LAB — COMPREHENSIVE METABOLIC PANEL
ALT: 43 U/L (ref 0–53)
AST: 44 U/L — ABNORMAL HIGH (ref 0–37)
Calcium: 9.6 mg/dL (ref 8.4–10.5)
Sodium: 139 mEq/L (ref 135–145)
Total Protein: 7.5 g/dL (ref 6.0–8.3)

## 2012-08-29 LAB — CBC WITH DIFFERENTIAL/PLATELET
Basophils Absolute: 0.1 10*3/uL (ref 0.0–0.1)
Basophils Relative: 1 % (ref 0–1)
Eosinophils Absolute: 0.1 10*3/uL (ref 0.0–0.7)
Eosinophils Relative: 1 % (ref 0–5)
MCH: 29.6 pg (ref 26.0–34.0)
MCHC: 36.8 g/dL — ABNORMAL HIGH (ref 30.0–36.0)
MCV: 80.3 fL (ref 78.0–100.0)
Platelets: 237 10*3/uL (ref 150–400)
RDW: 12.2 % (ref 11.5–15.5)
WBC: 6.8 10*3/uL (ref 4.0–10.5)

## 2012-08-29 MED ORDER — OXYCODONE-ACETAMINOPHEN 5-325 MG PO TABS
1.0000 | ORAL_TABLET | Freq: Once | ORAL | Status: AC
  Administered 2012-08-29: 1 via ORAL
  Filled 2012-08-29: qty 1

## 2012-08-29 MED ORDER — HYDROCODONE-ACETAMINOPHEN 5-325 MG PO TABS: 1.0000 | ORAL_TABLET | Freq: Four times a day (QID) | ORAL | Status: DC | PRN

## 2012-08-29 NOTE — ED Notes (Addendum)
Patient is in x-ray at this time 

## 2012-08-29 NOTE — ED Notes (Signed)
Rx x 1.  Pt voiced understanding to f/u with PCP and return for worsening condition.  

## 2012-08-29 NOTE — ED Notes (Signed)
The pt has had lt upper chest pain for 4-5 days with lt arm radiation with some sob.  None now.  No previous history only of hypertension

## 2012-08-29 NOTE — Plan of Care (Signed)
I received call from Tatyana to schedule an outpatient clinic appt for this patient as ED follow.  History of the patient was provided over the phone- 57 y/o gentleman who presents with exertional chest pain for 1 week . His risk factors include HTN, former smoking. On exam, he was tender to palpation in the mid sternum.   She thought that the pain was likely musculoskeletal in origin with one neg troponin and EKG showing normal sinus rhythm.   We discussed with the options of admitting the patient overnight vs discharging but she wanted to discharge the patient and just needed help from me in scheduling the outpatient appt.  I never went to see or examine the patient myself. I flagged the clinic front desk pool to schedule follow up appt for the patient in 1-2 days.Patient was d/c from ED.  Thanks, [employer]

## 2012-08-29 NOTE — ED Notes (Signed)
Pt c/o chest pain with pain in left arm numbness and pain that radiates through out shoulder blade.  Pt states that he is unable to sleep at night due to being uncomfortable. Symptoms present for five days.  Pt denies n/v/ sob. Pt dx with hypertension but is noncompliant

## 2012-08-29 NOTE — ED Provider Notes (Signed)
History     CSN: [id number]  Arrival date & time 08/29/12  1739   First MD Initiated Contact with Patient 08/29/12 1815      Chief Complaint  Patient presents with  . Chest Pain    (Consider location/radiation/quality/duration/timing/severity/associated sxs/prior treatment) HPI Sean Shannon is a 57 y.o. male who presents to ED with complaint of left upper arm and chest pain for 4 days. States pain originates in left upper arm, states radiates into left chest. Pain is sharp, constant, not worsened by exertion. Denies shortness of breath, nausea, diaphoresis. Pt states pain is not letting him sleep at night. States did start lifting weights with his son after not exercising for a while. States has hx of hypertension. Former smoker for [employer], quit in 1995. States no family hx of cardiac issues. No cardiac problems.  Past Medical History  Diagnosis Date  . Chest pain   . Headache   . Hypertension   . Hyperlipidemia   . Gonorrhea     remote h/o gonorrhea which was treated as per patient    Past Surgical History  Procedure Laterality Date  . Tonsilectomy, adenoidectomy, bilateral myringotomy and tubes  1959    Family History  Problem Relation Age of Onset  . Diabetes      Family history    History  Substance Use Topics  . Smoking status: Former Smoker    Types: Cigarettes    Quit date: 06/09/1993  . Smokeless tobacco: Not on file  . Alcohol Use: No      Review of Systems  Constitutional: Negative for fever and chills.  HENT: Negative for neck pain and neck stiffness.   Respiratory: Positive for chest tightness. Negative for shortness of breath.   Cardiovascular: Positive for chest pain. Negative for palpitations and leg swelling.  Gastrointestinal: Negative.   Genitourinary: Negative.   Musculoskeletal: Positive for myalgias.  Skin: Negative.   Neurological: Negative for weakness and numbness.    Allergies  Review of patient's allergies indicates no known  allergies.  Home Medications   Current Outpatient Rx  Name  Route  Sig  Dispense  Refill  . aspirin 325 MG EC tablet   Oral   Take 325 mg by mouth every 6 (six) hours as needed for pain.         Marland Kitchen HYDROcodone-acetaminophen (NORCO/VICODIN) 5-325 MG per tablet   Oral   Take 1 tablet by mouth every 6 (six) hours as needed for pain.           BP 160/104  Pulse 88  Temp(Src) 98.4 F (36.9 C) (Oral)  Resp 18  SpO2 98%  Physical Exam  Nursing note and vitals reviewed. Constitutional: He appears well-developed and well-nourished. No distress.  HENT:  Head: Normocephalic.  Eyes: Conjunctivae are normal.  Neck: Neck supple.  Cardiovascular: Normal rate, regular rhythm and normal heart sounds.   Pulmonary/Chest: Effort normal and breath sounds normal. No respiratory distress. He has no wheezes. He has no rales. He exhibits no tenderness.  Abdominal: Soft. Bowel sounds are normal. He exhibits no distension. There is no tenderness.  Musculoskeletal:  Tender to palpation over left tricep muscle. No pain with rom of elbow or shoulder. Strength intact  Neurological: He is alert.  Skin: Skin is warm and dry.    ED Course  Procedures (including critical care time)    Date: 08/29/2012  Rate: 88  Rhythm: normal sinus rhythm  QRS Axis: normal  Intervals: normal  ST/T Wave abnormalities:  normal  Conduction Disutrbances:none  Narrative Interpretation:   Old EKG Reviewed: none available Results for orders placed during the hospital encounter of 08/29/12  CBC WITH DIFFERENTIAL      Result Value Range   WBC 6.8  4.0 - 10.5 K/uL   RBC 5.44  4.22 - 5.81 MIL/uL   Hemoglobin 16.1  13.0 - 17.0 g/dL   HCT 45.4  09.8 - 11.9 %   MCV 80.3  78.0 - 100.0 fL   MCH 29.6  26.0 - 34.0 pg   MCHC 36.8 (*) 30.0 - 36.0 g/dL   RDW 14.7  82.9 - 56.2 %   Platelets 237  150 - 400 K/uL   Neutrophils Relative 55  43 - 77 %   Neutro Abs 3.7  1.7 - 7.7 K/uL   Lymphocytes Relative 33  12 - 46 %    Lymphs Abs 2.2  0.7 - 4.0 K/uL   Monocytes Relative 11  3 - 12 %   Monocytes Absolute 0.7  0.1 - 1.0 K/uL   Eosinophils Relative 1  0 - 5 %   Eosinophils Absolute 0.1  0.0 - 0.7 K/uL   Basophils Relative 1  0 - 1 %   Basophils Absolute 0.1  0.0 - 0.1 K/uL  COMPREHENSIVE METABOLIC PANEL      Result Value Range   Sodium 139  135 - 145 mEq/L   Potassium 4.5  3.5 - 5.1 mEq/L   Chloride 101  96 - 112 mEq/L   CO2 29  19 - 32 mEq/L   Glucose, Bld 124 (*) 70 - 99 mg/dL   BUN 12  6 - 23 mg/dL   Creatinine, Ser 1.30  0.50 - 1.35 mg/dL   Calcium 9.6  8.4 - 86.5 mg/dL   Total Protein 7.5  6.0 - 8.3 g/dL   Albumin 4.1  3.5 - 5.2 g/dL   AST 44 (*) 0 - 37 U/L   ALT 43  0 - 53 U/L   Alkaline Phosphatase 51  39 - 117 U/L   Total Bilirubin 1.1  0.3 - 1.2 mg/dL   GFR calc non Af Amer 81 (*) >90 mL/min   GFR calc Af Amer >90  >90 mL/min  TROPONIN I      Result Value Range   Troponin I <0.30  <0.30 ng/mL   Dg Chest 2 View  08/29/2012  *RADIOLOGY REPORT*  Clinical Data: Left-sided chest pain.  CHEST - 2 VIEW  Comparison: 09/10/2006.  Findings: The cardiac silhouette, mediastinal and hilar contours are within normal limits and stable.  The lungs are clear.  No pleural effusion.  The bony thorax is intact.  IMPRESSION: No acute cardiopulmonary findings.   Original Report Authenticated By: Rudie Meyer, M.D.       1. Chest pain   2. Left arm pain       MDM  Pt with left arm and chest pain. His pain is atypical. Constant for 4 days. Here troponin negative. ECG unremarkable. His risk factors for CAD are hypertension, hyperlipidimia, ex-smoker. Pain in his arm is reproducible. Discussed pt with his primary care team, who will arrange him a follow up in the next 2 days. Will treat with pain medications for now. BP also elevated here, down to 148/81 with no treatment. Pt informed. Follow up as needed. Return precautions given.     Filed Vitals:   08/29/12 1744 08/29/12 1954 08/29/12 2100  BP:  160/104 172/100 148/81  Pulse: 88 90 82  Temp: 98.4 F (36.9 C)    TempSrc: Oral    Resp: 18 14 13   SpO2: 98% 93% 94%       Lottie Mussel, PA-C 08/30/12 0046

## 2012-08-29 NOTE — ED Notes (Signed)
Tatyana, PA at bedside to speak with pt

## 2012-08-29 NOTE — ED Provider Notes (Signed)
History     CSN: [id number]  Arrival date & time 08/29/12  1623   First MD Initiated Contact with Patient 08/29/12 1641      Chief Complaint  Patient presents with  . Chest Pain    numbness down left arm. pain felt above elbow and in shoulder blade.     (Consider location/radiation/quality/duration/timing/severity/associated sxs/prior treatment) HPI Comments: 57 year old male former smoker with history of high blood pressure and hyperlipidemia. Noncompliant with blood pressure medications. Here complaining of left side chest pain and left arm pain for 5 days. No radiation to the left side of the neck or jaw. Patient stated he was lifting weight as a new exercise routine days prior his pain started. Pain has been constant and not associated with shortness of breath. Waking him at nighttime. Not associated with diaphoresis or nausea. Pain is worse with movement, radiation to the left subscapular area.    Past Medical History  Diagnosis Date  . Chest pain   . Headache   . Hypertension   . Hyperlipidemia   . Gonorrhea     remote h/o gonorrhea which was treated as per patient    Past Surgical History  Procedure Laterality Date  . Tonsilectomy, adenoidectomy, bilateral myringotomy and tubes  1959    Family History  Problem Relation Age of Onset  . Diabetes      Family history    History  Substance Use Topics  . Smoking status: Former Smoker    Types: Cigarettes    Quit date: 06/09/1993  . Smokeless tobacco: Not on file  . Alcohol Use: No      Review of Systems  Constitutional: Negative for fever, chills, diaphoresis and fatigue.  Respiratory: Negative for cough, shortness of breath and wheezing.   Cardiovascular: Positive for chest pain.  Gastrointestinal: Negative for nausea, vomiting and abdominal pain.  Skin: Negative for rash.    Allergies  Review of patient's allergies indicates no known allergies.  Home Medications   Current Outpatient Rx  Name  Route   Sig  Dispense  Refill  . terazosin (HYTRIN) 1 MG capsule   Oral   Take 1 capsule (1 mg total) by mouth at bedtime. Take 40 minutes before bedtime   30 capsule   2     BP 158/101  Pulse 89  Temp(Src) 98.6 F (37 C) (Oral)  SpO2 97%  Physical Exam  Nursing note and vitals reviewed. Constitutional: He is oriented to person, place, and time. He appears well-developed and well-nourished.  Uncomfortable with pain  HENT:  Head: Normocephalic and atraumatic.  Eyes: Conjunctivae are normal. No scleral icterus.  Neck: Neck supple. No JVD present. No thyromegaly present.  Cardiovascular: Normal rate, regular rhythm, normal heart sounds and intact distal pulses.  Exam reveals no gallop and no friction rub.   No murmur heard. No low extremity edema  Pulmonary/Chest: Effort normal and breath sounds normal. No respiratory distress. He has no wheezes. He has no rales. He exhibits no tenderness.  Musculoskeletal:  Left elbow: No swelling or deformity. There is focal tenderness with palpation at the insertion of the triceps. Patient points to pectoral muscle bad pain nonreproducible with palpation, although reports pain worse with left arm and shoulder abduction.    Neurological: He is alert and oriented to person, place, and time.  Skin: No rash noted. He is not diaphoretic.    ED Course  Procedures (including critical care time)  Labs Reviewed - No data to display No results found.  1. Chest pain radiating to arm    EKG: Normal sinus rhythm with a ventricular rate of 84 beats per minutes, no AV or BB blocks no acute ischemic changes.   MDM  57 year old male former smoker with history of high blood pressure and hyperlipidemia. Here complaining of left side chest pain and left arm pain for 5 days.   On exam: Patient is hypertensive here with a blood pressure on 158/101. Uncomfortable with pain. Lungs are clear to auscultation, heart sounds normal with no rubs or gallops. Normal  abdominal exam. There is focal tenderness at the tricipital insertion in the left elbow. Chest pain not reproducible with anterior chest palpation but worse with left arm abduction. EKG: Normal sinus rhythm with no acute ischemic changes. Impress musculoskeletal pain in origin but decided to transfer patient to the emergency department for further evaluation.        Sharin Grave, MD 08/30/12 1531

## 2012-08-30 NOTE — ED Provider Notes (Signed)
Medical screening examination/treatment/procedure(s) were performed by non-physician practitioner and as supervising physician I was immediately available for consultation/collaboration.  Glender Augusta, MD 08/30/12 0829 

## 2012-08-31 ENCOUNTER — Ambulatory Visit: Payer: Self-pay

## 2012-09-02 ENCOUNTER — Ambulatory Visit (INDEPENDENT_AMBULATORY_CARE_PROVIDER_SITE_OTHER): Payer: No Typology Code available for payment source | Admitting: Internal Medicine

## 2012-09-02 ENCOUNTER — Ambulatory Visit: Payer: Self-pay

## 2012-09-02 ENCOUNTER — Ambulatory Visit: Payer: Self-pay | Admitting: Internal Medicine

## 2012-09-02 ENCOUNTER — Encounter: Payer: Self-pay | Admitting: Internal Medicine

## 2012-09-02 VITALS — BP 159/99 | HR 75 | Temp 97.8°F | Ht 71.0 in | Wt 168.6 lb

## 2012-09-02 DIAGNOSIS — Z87898 Personal history of other specified conditions: Secondary | ICD-10-CM

## 2012-09-02 DIAGNOSIS — N529 Male erectile dysfunction, unspecified: Secondary | ICD-10-CM | POA: Insufficient documentation

## 2012-09-02 DIAGNOSIS — I10 Essential (primary) hypertension: Secondary | ICD-10-CM

## 2012-09-02 MED ORDER — TADALAFIL 5 MG PO TABS: 5.0000 mg | ORAL_TABLET | Freq: Every day | ORAL | Status: DC | PRN

## 2012-09-02 MED ORDER — HYDROCHLOROTHIAZIDE 25 MG PO TABS: 25.0000 mg | ORAL_TABLET | Freq: Every day | ORAL | Status: AC

## 2012-09-02 NOTE — Patient Instructions (Signed)
1.  Start Hydrochlorothiazide 25 mg tablets.  Take 1 tablet daily for your blood pressure.  2.  Use the Cialis 1 tablet about 30 minutes prior to sexual activity.

## 2012-09-02 NOTE — Progress Notes (Signed)
Subjective:   Patient ID: Sean Shannon male   DOB: [birth year] 57 y.o.   MRN: [id number]  HPI: Mr.Sean Shannon is a 57 y.o. man who presents to clinic today for follow up from his ED visit on 3/23.  He states that he has some mild residual left arm pain near the left elbow.  He has a history of hypertension and has not be on medication in the past for it. He denies headaches or changes in his vision.   He also notes that he has been having problems with BPH and erectile dysfunction.  He states that this has ben over the last 1-2 years.  He has trouble getting as well as maintaining an erection.    Past Medical History  Diagnosis Date  . Chest pain   . Headache   . Hypertension   . Hyperlipidemia   . Gonorrhea     remote h/o gonorrhea which was treated as per patient   Current Outpatient Prescriptions  Medication Sig Dispense Refill  . aspirin 325 MG EC tablet Take 325 mg by mouth every 6 (six) hours as needed for pain.      Marland Kitchen HYDROcodone-acetaminophen (NORCO) 5-325 MG per tablet Take 1 tablet by mouth every 6 (six) hours as needed for pain.  10 tablet  0  . HYDROcodone-acetaminophen (NORCO/VICODIN) 5-325 MG per tablet Take 1 tablet by mouth every 6 (six) hours as needed for pain.       No current facility-administered medications for this visit.   Family History  Problem Relation Age of Onset  . Diabetes      Family history   History   Social History  . Marital Status: Significant Other    Spouse Name: N/A    Number of Children: N/A  . Years of Education: N/A   Occupational History  . Corporate investment banker    Social History Main Topics  . Smoking status: Former Smoker    Types: Cigarettes    Quit date: 06/09/1993  . Smokeless tobacco: None  . Alcohol Use: No  . Drug Use: No     Comment: Hx of heroine for 1 year.  Quit in 11/2009  . Sexually Active: Yes    Birth Control/ Protection: Condom   Other Topics Concern  . None   Social History Narrative   Single, no children.   Sean Shannon, lives with his fiance in a house   Review of Systems: A full 12 system ROS is negative except as noted in the HPI and A&P.   Objective:  Physical Exam: Filed Vitals:   09/02/12 0902 09/02/12 0903  BP: 159/99   Pulse: 75   Temp: 97.8 F (36.6 C)   TempSrc: Oral   Height: 5\' 11"  (1.803 m)   Weight: 168 lb 9.6 oz (76.476 kg) 168 lb 9.6 oz (76.476 kg)  SpO2: 97%    Constitutional: Vital signs reviewed.  Patient is a well-developed and well-nourished man in no acute distress and cooperative with exam. Alert and oriented x3.  Head: Normocephalic and atraumatic Ear: TM normal bilaterally Mouth: no erythema or exudates, MMM Eyes: PERRL, EOMI, conjunctivae normal, No scleral icterus.  Neck: Supple, Trachea midline normal ROM, No JVD, mass, thyromegaly, or carotid bruit present.  Cardiovascular: RRR, S1 normal, S2 normal, no MRG, pulses symmetric and intact bilaterally Pulmonary/Chest: CTAB, no wheezes, rales, or rhonchi Abdominal: Soft. Non-tender, non-distended, bowel sounds are normal, no masses, organomegaly, or guarding present.  GU: no CVA tenderness Musculoskeletal: mild  point tenderness at the distal insertion of the triceps on the left. No joint deformities, erythema, or stiffness, ROM full and no nontender Hematology: no cervical, inginal, or axillary adenopathy.  Neurological: A&O x3, Strength is normal and symmetric bilaterally, cranial nerve II-XII are grossly intact, no focal motor deficit, sensory intact to light touch bilaterally.  Skin: Warm, dry and intact. No rash, cyanosis, or clubbing.  Psychiatric: Normal mood and affect. speech and behavior is normal. Judgment and thought content normal. Cognition and memory are normal.   Assessment & Plan:

## 2012-09-21 ENCOUNTER — Ambulatory Visit (INDEPENDENT_AMBULATORY_CARE_PROVIDER_SITE_OTHER): Payer: No Typology Code available for payment source | Admitting: Radiation Oncology

## 2012-09-21 ENCOUNTER — Encounter: Payer: Self-pay | Admitting: Radiation Oncology

## 2012-09-21 VITALS — BP 131/84 | HR 85 | Temp 97.6°F | Ht 71.0 in | Wt 162.7 lb

## 2012-09-21 DIAGNOSIS — N529 Male erectile dysfunction, unspecified: Secondary | ICD-10-CM

## 2012-09-21 DIAGNOSIS — Z87898 Personal history of other specified conditions: Secondary | ICD-10-CM

## 2012-09-21 DIAGNOSIS — I10 Essential (primary) hypertension: Secondary | ICD-10-CM

## 2012-09-21 MED ORDER — TADALAFIL 10 MG PO TABS: 10.0000 mg | ORAL_TABLET | Freq: Every day | ORAL | Status: DC | PRN

## 2012-09-21 NOTE — Patient Instructions (Addendum)
General Instructions:  Take cialis 10-20mg  daily as needed. Be sure never to take any nitrate medications while taking this medicine.  Continue taking your HCTZ as prescribed. We will contact you if any adjustments need to be made.   Have a great day.    Treatment Goals:  Goals (1 Years of Data) as of 09/21/12         As of Today 09/02/12 08/29/12 08/29/12 08/29/12     Blood Pressure    . Blood Pressure < 140/90  131/84 159/99 148/81 172/100 160/104      Progress Toward Treatment Goals:  Treatment Goal 09/21/2012  Blood pressure at goal    Self Care Goals & Plans:  Self Care Goal 09/21/2012  Manage my medications take my medicines as prescribed; bring my medications to every visit; refill my medications on time  Monitor my health -  Eat healthy foods drink diet soda or water instead of juice or soda; eat more vegetables; eat foods that are low in salt  Be physically active -  Other -

## 2012-09-21 NOTE — Assessment & Plan Note (Signed)
Limited relief of this issue with dialysis 5 mg daily as needed. As Cialis dose can be titrated to as high as 20 mg daily as needed for this issue, will increase to 10-20 mg daily as needed. Patient was instructed to first start with 10 mg to assess tolerance before taking 20 mg in a single day.he was counseled to avoid nitroglycerin and nitrate-containing medications.  - cialis 10-20mg  QD PRN

## 2012-09-21 NOTE — Assessment & Plan Note (Signed)
BP Readings from Last 3 Encounters:  09/21/12 131/84  09/02/12 159/99  08/29/12 148/81    Lab Results  Component Value Date   NA 139 08/29/2012   K 4.5 08/29/2012   CREATININE 1.00 08/29/2012    Assessment: Blood pressure control: controlled Progress toward BP goal:  at goal Comments:   Plan: Medications:  continue current medications (HCTZ 25mg  QD) Educational resources provided: brochure Self management tools provided: home blood pressure logbook Other plans:

## 2012-09-21 NOTE — Progress Notes (Signed)
  Subjective:    Patient ID: Sean Shannon, male    DOB: [birth year], 57 y.o.   MRN: [id number]  HPI Patient is a 57 year old man with PMH significant for hypertension who presents to clinic today for followup regarding this issue after having started HCTZ 25 mg approximately 3 weeks ago. Patient has been tolerating this medication well and without any adverse effects. He was also recently prescribed Cialis 5 mg daily as needed for erectile dysfunction, however he states that while this medication is helping to some extent, he still has significant difficulty with this issue. He has no other complaints today, and states he's feeling well.  Review of Systems  All other systems reviewed and are negative.       Objective:   Physical Exam  Constitutional: He is oriented to person, place, and time. He appears well-developed and well-nourished. No distress.  HENT:  Head: Normocephalic and atraumatic.  Eyes: Conjunctivae are normal. Pupils are equal, round, and reactive to light. No scleral icterus.  Neck: Normal range of motion. No tracheal deviation present.  Cardiovascular: Normal rate and regular rhythm.   No murmur heard. Pulmonary/Chest: Effort normal. He has no wheezes. He has no rales.  Abdominal: Soft. Bowel sounds are normal. He exhibits no distension. There is no tenderness.  Musculoskeletal: Normal range of motion. He exhibits no edema.  Neurological: He is alert and oriented to person, place, and time. No cranial nerve deficit.  Skin: Skin is warm and dry. No erythema.  Psychiatric: He has a normal mood and affect. His behavior is normal.          Assessment & Plan:

## 2012-09-27 ENCOUNTER — Encounter: Payer: Self-pay | Admitting: Internal Medicine

## 2012-12-05 NOTE — Assessment & Plan Note (Signed)
BP Readings from Last 5 Encounters:  09/02/12 159/99  08/29/12 148/81  08/29/12 158/101   Blood pressure today is mildly elevated.  We will start HCTZ 25 mg daily for his blood pressure.

## 2012-12-05 NOTE — Assessment & Plan Note (Signed)
He has mild BPH and erectile dysfunction.  Cialis has a dual indication so we will use that to treat his ED with the benefit of helping his BPH as well.

## 2012-12-05 NOTE — Assessment & Plan Note (Signed)
We will start cialis for his ED as well as for treatment of his BPH.

## 2012-12-16 NOTE — Addendum Note (Signed)
Addended by: Remus Blake on: 12/16/2012 09:17 AM   Modules accepted: Orders

## 2013-01-25 ENCOUNTER — Encounter: Payer: Self-pay | Admitting: Internal Medicine

## 2013-01-25 ENCOUNTER — Encounter: Payer: Self-pay | Admitting: Licensed Clinical Social Worker

## 2013-01-25 ENCOUNTER — Ambulatory Visit (INDEPENDENT_AMBULATORY_CARE_PROVIDER_SITE_OTHER): Payer: No Typology Code available for payment source | Admitting: Internal Medicine

## 2013-01-25 VITALS — BP 147/93 | HR 75 | Temp 97.8°F | Ht 70.0 in | Wt 152.4 lb

## 2013-01-25 DIAGNOSIS — N529 Male erectile dysfunction, unspecified: Secondary | ICD-10-CM

## 2013-01-25 DIAGNOSIS — Z87898 Personal history of other specified conditions: Secondary | ICD-10-CM

## 2013-01-25 DIAGNOSIS — I10 Essential (primary) hypertension: Secondary | ICD-10-CM

## 2013-01-25 DIAGNOSIS — F1910 Other psychoactive substance abuse, uncomplicated: Secondary | ICD-10-CM | POA: Insufficient documentation

## 2013-01-25 MED ORDER — LOPERAMIDE HCL 2 MG PO CAPS: 2.0000 mg | ORAL_CAPSULE | Freq: Four times a day (QID) | ORAL | Status: DC | PRN

## 2013-01-25 MED ORDER — PROMETHAZINE HCL 12.5 MG PO TABS: 12.5000 mg | ORAL_TABLET | Freq: Four times a day (QID) | ORAL | Status: DC | PRN

## 2013-01-25 NOTE — Assessment & Plan Note (Addendum)
Pt has been snorting heroin and cocaine for past several months, last use on Sunday 8/17; denies IV drug use.  Pt experiencing n/v/d, tremulousness, restlessness. Pt now seeking intensive outpatient treatment, motivated to stay clean to set a good example for his fiancee's teenage children. Has taken suboxone in past through [facility].  He admits that inpatient treatment is more effective, and he may require this if outpatient treatment fails.   -pt met with Shana today; will pursue treatment at [facility] where he can be seen by end of this week -in meantime, provided rx for promethazine and loperamide for symptomatic relief

## 2013-01-25 NOTE — Progress Notes (Signed)
I saw and evaluated the patient.  I personally confirmed the key portions of the history and exam documented by Dr. Rogers and I reviewed pertinent patient test results.  The assessment, diagnosis, and plan were formulated together and I agree with the documentation in the resident's note. 

## 2013-01-25 NOTE — Progress Notes (Addendum)
Mr. Sean Shannon was referred to CSW for community resources for heroin addiction.  Pt is uninsured and has the [employer].  Pt has tried inpatient substance abuse services and has been on suboxone in the past.  CSW met with Mr. Sean Shannon and significant other.  CSW provided AOD referral listing, specifying [facility] as the best resource for Mr. Sean Shannon, as they offer both Intensive Outpatient and have a methadone program.  CSW informed pt and SO currently a 2-3 day wait for IOP and 2-6 week wait for methadone program.  CSW provide number to call for triage services, once pt calls for triage pt will be scheduled appt for intake.  SO states pt had an appt at [facility] but did not show for appt.  SO is a support for Mr. Sean Shannon, pt pleasant not too verbal.  SO inquiring if prescribed medication is generic form and cost.  CSW informed pt/SO imodium can be OTC and promethazine 12.5mg  is on the $4 listing at [facility], SO has [facility] card.

## 2013-01-25 NOTE — Assessment & Plan Note (Signed)
Pt not interested in taking Cialis in future.  Consider terazosin for BPH after heroin cessation since this has worked in past.

## 2013-01-25 NOTE — Progress Notes (Signed)
Patient ID: RADEN BYINGTON, male   DOB: [birth year], 57 y.o.   MRN: [id number]   Subjective:   Patient ID: MOIZ RYANT male   DOB: [birth year] 57 y.o.   MRN: [id number]  HPI: Mr.Nirvan MONTY SPICHER is a 57 y.o. man with history of HTN, ?HL, BPH, ED, polysubstance abuse who presents for heroin withdrawal, seeking treatment.   Pt got out of prison in 2/14 and since that time has gotten back into old habits of drug use.  States that he has been snorting heroin and cocaine for the past several months, last use on Sunday 8/17; he is now seeking treatment. Denies any IV drug use. He has had nausea and NBNB vomiting once daily since Sunday.  He has also been having non-bloody diarrhea.  He feels restless and tremulous.  He was yawning throughout the interview.   Pt lives at home with his fiancee and her three teenage children.  States that his motivation for quitting again is to set a good example for the children.  Pt has sought treatment for heroin use in the past, both inpatient treatment (last in 12/11) and intensive outpatient treatment (last in 4/12).  He admits that inpatient treatment is more effective but desires to try outpatient treatment first.  He has been on suboxone in the past, last time was in 2012 through [facility]; states that it worked well for him then.    In terms of his HTN, pt has not been taking HCTZ.  He also states that Cialis does not work so he has not been taking it and does not wish to take it in the future.    Past Medical History  Diagnosis Date  . Chest pain   . Headache(784.0)   . Hypertension   . Hyperlipidemia   . Gonorrhea     remote h/o gonorrhea which was treated as per patient   Current Outpatient Prescriptions  Medication Sig Dispense Refill  . aspirin 325 MG EC tablet Take 325 mg by mouth every 6 (six) hours as needed for pain.      . hydrochlorothiazide (HYDRODIURIL) 25 MG tablet Take 1  tablet (25 mg total) by mouth daily.  30 tablet  11  . loperamide (IMODIUM) 2 MG capsule Take 1 capsule (2 mg total) by mouth 4 (four) times daily as needed for diarrhea or loose stools.  30 capsule  0  . promethazine (PHENERGAN) 12.5 MG tablet Take 1 tablet (12.5 mg total) by mouth every 6 (six) hours as needed for nausea.  30 tablet  0   No current facility-administered medications for this visit.   Family History  Problem Relation Age of Onset  . Diabetes      Family history   History   Social History  . Marital Status: Significant Other    Spouse Name: N/A    Number of Children: N/A  . Years of Education: N/A   Occupational History  . Corporate investment banker    Social History Main Topics  . Smoking status: Former Smoker    Types: Cigarettes    Quit date: 06/09/1993  . Smokeless tobacco: None  . Alcohol Use: No  . Drug Use: No     Comment: Hx of heroine for 1 year.  Quit in 11/2009  . Sexual Activity: Yes    Birth Control/ Protection: Condom   Other Topics Concern  . None   Social History Narrative   Single, no children.  [name], lives with his fiance in a house   Review of Systems: Review of Systems  Constitutional: Negative for fever, chills, weight loss and diaphoresis.  Eyes: Negative for blurred vision.  Respiratory: Negative for cough and shortness of breath.   Cardiovascular: Negative for chest pain, palpitations and leg swelling.  Gastrointestinal: Positive for nausea, vomiting, abdominal pain and diarrhea. Negative for blood in stool and melena.  Genitourinary: Negative for dysuria.  Musculoskeletal: Negative for myalgias and falls.  Neurological: Positive for tingling and tremors. Negative for dizziness, seizures, loss of consciousness, weakness and headaches.    Objective:  Physical Exam: Filed Vitals:   01/25/13 0839  BP: 147/93  Pulse: 75  Temp: 97.8 F (36.6 C)  TempSrc: Oral  Height: 5\' 10"  (1.778 m)  Weight: 152 lb 6.4 oz  (69.128 kg)  SpO2: 97%   General: alert, cooperative, and in no apparent distress but yawning throughout interview HEENT: no mydriasis, pupils equal round and reactive to light, vision grossly intact, oropharynx clear and non-erythematous  Neck: supple, no lymphadenopathy, JVD, or carotid bruits Lungs: clear to ascultation bilaterally, normal work of respiration, no wheezes, rales, ronchi Heart: regular rate and rhythm, no murmurs, gallops, or rubs Abdomen: soft, non-tender, non-distended, normal bowel sounds Extremities: prominent veins, no piloerection; no cyanosis, clubbing, or edema Neurologic: alert & oriented X3, cranial nerves II-XII intact, strength grossly intact, sensation intact to light touch  Assessment & Plan:  Patient discussed with Dr. Rogelia Boga.  Please see problem-based assessment and plan.

## 2013-01-25 NOTE — Patient Instructions (Addendum)
Please follow-up with Dr. Darci Needle in 1-2 months or sooner if needed.   Congratulations on making the decision to seek treatment for your heroin use.  The social worker will discuss options for outpatient treatment centers today.  If you feel that you need inpatient treatment instead, please call the clinic and let us know so we can help arrange this.   We are giving you prescriptions for two medicines today, one called promethazine to help with nausea, and one called loperamide to help with diarrhea.    Heroin Abuse and Withdrawal Heroin is an illegal opiate (narcotic) drug. It is very addictive. Once the effects of the drug are felt, the need to use the drug again (known as craving) is strong. When the drug is suddenly stopped after using it on a regular basis, significant and painful physical withdrawal symptoms are experienced.  EFFECTS OF HEROIN USE AND ABUSE The drug produces a profound feeling of pleasure and relaxation (euphoria) that lasts for a short time. This is followed by sleepiness and then agitation with craving for more drugs. After a short time of regular use (days or weeks), dependence on the drug is developed. This means the user will become ill without it (withdrawal) and needs to keep using the drug to function. This is how fast heroin abuse becomes physical dependence and addiction (chemical dependency). EFFECTS ON THE BRAIN Heroin is addictive because it activates regions of the brain that are responsible for producing both the pleasurable sensation of "reward" and physical dependence. Together, these actions account for the user's loss of control and the rapid development of drug dependence. HOW IT IS USED  Heroin is a drug that is mostly taken by a shot with a needle (injection) in the vein. Using a needle can have harmful effects on the user. Often times, the needle is unclean (unsterile), the heroin is contaminated with cutting agents, or heroin is used in combination with  other drugs such as alcohol or cocaine.  In recent years, the purity of street heroin has increased a lot allowing users to "snort" the drug into their noses where it is absorbed through the lining of the nasal passages. You can become addicted this way. Many IV heroin users report they started by snorting. RELATED PROBLEMS  Needle sharing can cause AIDS. The AIDS virus is carried in contaminated blood left in the needle, syringe or other drug-related equipment. When unclean needles are used, the AIDS virus is injected into the new user. There is no cure or proven vaccine for AIDS to prevent it.  Heroin use during pregnancy is associated with stillbirths. It can also increase the chance for miscarriage. Babies born addicted to heroin must undergo withdrawal after birth. These babies show a number of developmental problems.  Heroin use can cause serious health problems such as liver infection (hepatitis), skin abscesses, vein inflammation and heart disease. SYMPTOMS The signs and symptoms of heroin use include:  Euphoria.  Drowsiness.  Respiratory depression (which can progress until breathing stops).  Small (constricted) pupils and nausea.  Need for increasingly higher doses of the drug to get the same effect.  Weight loss.  "Track" marks (scars) on arms and legs.  Emotional instability. Symptoms of a heroin overdose include:   Shallow breathing.  Pinpoint pupils.  Clammy skin.  Convulsion.  Coma. WITHDRAWAL EFFECTS Withdrawal symptoms include:  Watery eyes.  Runny nose.  Yawning.  Loss of appetite.  Tremors.  Panic.  Chills.  Sweating.  Nausea.  Muscle cramps.  Not being able to sleep well (insomnia).  Depression and mood swings.  Elevations in blood pressure, pulse, respiratory rate and temperature. RISK OF OVERDOSE Because the drug is produced illegally, heroin users risk taking an unusually potent dose (due to differences in purity). This can  lead to overdose, coma and possible death. Especially dangerous is the situation where a user has been "clean" (abstinent) from using the drug for a period of time and then relapses. Fatal overdoses can occur because the user fails to account for the change in the body's tolerance to the drug. TREATMENT  There are many treatment options for heroin addiction. They include medications as well as behavioral therapies. Research has demonstrated that many people can successfully stop using heroin when medication is combined with other supportive services. Patients can return to stable and productive lives, living completely free of all narcotics. Some of the medications and other treatment approaches used are:  Methadone. This is a medication that blocks the effects of heroin for about 24 hours. It has a proven record of success when prescribed at a high enough dosage level for people addicted to heroin.  Naloxone may be used to treat cases of overdose, as well as naltrexone. Both of these block the effects of morphine, heroin and other opiates. People who have become free of narcotics often take naltrexone to avoid relapse.  Buprenorphine is now available for treating addiction to heroin and other opiates. This medication offers less risk of addiction. It can be dispensed in the privacy of a doctor's office.  Several other medications for use in heroin treatment programs are also under study.  There are many effective behavioral treatments available for heroin addiction. These can include residential and outpatient approaches. 12-step programs, such as Narcotics Anonymous, have also proven to be effective. HOME CARE INSTRUCTIONS  Some individuals can successfully stop using heroin at home with medically assisted detoxification. This requires following your caregiver's instructions very carefully. This may include use of some of the following medications and therapies:  Stomach medicine can be used for  the nausea.  Imodium can be used for the diarrhea.  Benadryl can be used for the sleeplessness and restlessness.  Anxiety medication such as Xanax or Ativan. These must be administered exactly as prescribed, usually with the help of a home caregiver.  Avoiding dehydration by drinking more fluids than usual. While water alone is fine, any non-alcoholic fluid is okay (soup, juice, etc.).  Providing quiet, calm support and cooling or warmth as needed.  Call your local emergency services (911 in [place] if seizures occur or if the patient is unable to keep liquids down.  Keep a written record of medications given and times given. Addiction cannot be cured but it can be treated successfully.   Treatment centers are listed in the yellow pages under:  Substance Abuse.  Narcotics Anonymous.  Drug Abuse Counseling.  Most hospitals and clinics can refer you to a specialized care center.  The [place] government maintains a toll-free number for obtaining treatment referrals:1-424-032-1557 or 559 069 4531 (TDD) and maintains a website: http://findtreatment.RockToxic.pl.  Other websites for additional information are:  www.mentalhealth.RockToxic.pl.  GreatestFeeling.tn.  In [place] treatment resources are listed in each [place] with listings available under:  Oncologist for [employer] or similar titles. Document Released: 05/29/2003 Document Revised: 08/18/2011 Document Reviewed: 05/12/2008 [facility] Patient Information 2014 [place], [place].

## 2013-01-25 NOTE — Assessment & Plan Note (Signed)
Pt does not think Cialis works for his ED, has not been taking this medication and does not wish to re-start in future.

## 2013-01-25 NOTE — Assessment & Plan Note (Signed)
BP mildly elevated at 147/93 today.  Pt prescribed HCTZ 25 mg daily in past but has not been taking.   -consider re-starting HCTZ after heroin cessation -f/u with PCP in 1 month

## 2013-03-08 ENCOUNTER — Ambulatory Visit: Payer: Self-pay

## 2013-04-28 ENCOUNTER — Encounter: Payer: Self-pay | Admitting: Internal Medicine

## 2013-04-28 ENCOUNTER — Ambulatory Visit (INDEPENDENT_AMBULATORY_CARE_PROVIDER_SITE_OTHER): Payer: No Typology Code available for payment source | Admitting: Internal Medicine

## 2013-04-28 VITALS — BP 133/91 | HR 85 | Ht 70.0 in | Wt 156.5 lb

## 2013-04-28 DIAGNOSIS — Z87898 Personal history of other specified conditions: Secondary | ICD-10-CM

## 2013-04-28 DIAGNOSIS — I10 Essential (primary) hypertension: Secondary | ICD-10-CM

## 2013-04-28 DIAGNOSIS — F1910 Other psychoactive substance abuse, uncomplicated: Secondary | ICD-10-CM

## 2013-04-28 DIAGNOSIS — N529 Male erectile dysfunction, unspecified: Secondary | ICD-10-CM

## 2013-04-28 DIAGNOSIS — Z299 Encounter for prophylactic measures, unspecified: Secondary | ICD-10-CM

## 2013-04-28 MED ORDER — TADALAFIL 10 MG PO TABS: 10.0000 mg | ORAL_TABLET | ORAL | Status: AC | PRN

## 2013-04-28 NOTE — Assessment & Plan Note (Addendum)
Patient still snorting both heroin and cocaine approx twice weekly, no IVDU. Social/personal/professional functioning impaired. Last used last night, pupils are pinpoint today. He is currently enrolled in an outpatient program through [facility]. He is currently managed on methadone. I am concerned that patient may be removed from this program since he continues to use. He may benefit from inpatient rehab. Patient and fiancee both agree that inpatient rehab may be more successful, though patient would like to keep trying with outpatient for now as he states "I can do better." I discussed that patient can always return to clinic if he wants to pursue inpatient rehab. He has no withdrawal symptoms today or since August 2014 when he was last seen, so symptomatic treatment not necessary today. I applauded patient's efforts to cut back. We had a long conversation about how he can try to stop using completely. He is going to try this and we will pursue inpatient options if necessary. I am very concerned that outpatient treatment is not enough for this patient who has years of addiction behind him.

## 2013-04-28 NOTE — Patient Instructions (Signed)
Thank you for your visit.  Please continue taking your medication for your prostate if it is working for you. Please call my office to let us know what medication it is and what the dosage is.  I re-prescribed Cialis 10mg  to use as needed for your erectile dysfunction.  DO NOT use this medication with any nitrate medication.   You can call our office to receive the flu shot. Just ask for a "nurse visit."  Continue taking your hydrochlorothiazide for your blood pressure. You blood pressure is well controlled today.  I am happy that you are pursuing treatment for your heroin and cocaine use. Please let us know at any time if you think you would be better served with an inpatient rehab facility.  Follow up with me in 6 months.

## 2013-04-28 NOTE — Assessment & Plan Note (Addendum)
Symptoms very well controlled on the new medication patient has been taking that was provided to him by another physician. He will call our office to let us know the name of this medication and the dosing. I will be happy to refill this medication as long as I know this information.   ADDENDUM: Fiance called and he is having good control of BPH symptoms w/ Terazosin 1mg  at bedtime.

## 2013-04-28 NOTE — Assessment & Plan Note (Signed)
Declined flu shot today. I encouraged patient to think about coming back to get it if he changes his mind. I instructed him to schedule a nurse only visit if he would like to receive the flu shot.

## 2013-04-28 NOTE — Progress Notes (Signed)
Patient ID: Sean Shannon, male   DOB: 1956/05/28, 57 y.o.   MRN: [id number] HPI The patient is a 57 y.o. male with a history of HTN, ED, HLD? (last lipid panel in 2012 was wnl), polysubstance abuse (snorts both heroin and cocaine, no IVDU), BPH, Hep C who presents to clinic for a routine visit.  ED: Patient had previously been on Cialis 5mg  daily, but patient stated at his last visit he no longer wanted to take it since it did not seem to work. Therefore, this medication had been stopped. He saw another physician who put him on Cialis 10mg  as needed for his ED. Working better than the 5mg  dosing and wants a refill today.  BPH: Patient had been taking Cialis 5mg  daily to help with BPH at his last visit, but this was not working well. Patient saw another physician who prescribed a medication (?Flomax) for BPH, which is working well to control his symptoms. He denies urinary hesitancy, feeling of incomplete evacuation, dribbling, dysuria, hematuria, abd pain or fullness. He has nocturia once per night, but this is patient's baseline prior to having issues with BPH. He is unable to tell me what it is called, but states he will call me tomorrow to let the office know both the name and dosage of this medication.   HTN: Patient is taking HCTZ 25mg  daily. BP today is 133/91, which is at goal. No issues with this medication. No HAs or changes in vision.  Polysubstance abuse: Patient has long hx of snorting both heroin and cocaine. He has been enrolled in multiple rehab facilities, including inpatient in 2011 and outpatient [facility] in 2012) at which time he was managed on suboxone. He has relapsed each time. During his last visit in August 2014 patient was experiencing opioid w/d and was managed symptomatically as outpatient. He was referred to [facility], which he is currently enrolled in and is receiving methadone therapy (goes to classes 3 times per  week). Patient continues to snort both cocaine and heroin approx 2 times per week, last used heroin last night around midnight. Main barrier to cessation is fear of withdrawal. Patient has not experienced w/d symptoms since he was here in August. Patient thinks that he might have more success with an inpatient program as his continued use is causing problems in his personal relationships as well as work. 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   ROS: General: no fevers, chills, changes in weight, changes in appetite Skin: no rash HEENT: no blurry vision, hearing changes, sore throat Pulm: no dyspnea, coughing, wheezing CV: no chest pain, palpitations, shortness of breath Abd: no abdominal pain, nausea/vomiting, diarrhea/constipation GU: no dysuria, hematuria, polyuria Ext: no arthralgias, myalgias Neuro: no weakness, numbness, or tingling  Filed Vitals:   04/28/13 1333  BP: 133/91  Pulse: 85    PEX General: disheveled, alert, cooperative, and in no apparent distress HEENT: pupils pinpoint bilaterally, vision grossly intact, oropharynx clear and non-erythematous; poor dentition with multiple teeth missing; inflamed nasal turbinates bilaterally, but no gross abnormality or discharge present  Neck: supple, no lymphadenopathy Lungs: clear to ascultation bilaterally, normal work of respiration, no wheezes, rales, ronchi Heart: regular rate and rhythm, no murmurs, gallops, or rubs Abdomen: soft, non-tender, non-distended, normal bowel sounds Extremities: warm extremities b/l, no BLE edema; no track marks or palpable cords on BUE Neurologic: alert & oriented X3, cranial nerves II-XII grossly intact, strength grossly intact,  sensation intact to light touch  Current Outpatient Prescriptions on File Prior to Visit  Medication Sig Dispense Refill  . aspirin 325 MG EC tablet Take 325 mg by mouth every 6 (six) hours as needed for pain.      . hydrochlorothiazide  (HYDRODIURIL) 25 MG tablet Take 1 tablet (25 mg total) by mouth daily.  30 tablet  11  . loperamide (IMODIUM) 2 MG capsule Take 1 capsule (2 mg total) by mouth 4 (four) times daily as needed for diarrhea or loose stools.  30 capsule  0  . promethazine (PHENERGAN) 12.5 MG tablet Take 1 tablet (12.5 mg total) by mouth every 6 (six) hours as needed for nausea.  30 tablet  0   No current facility-administered medications on file prior to visit.    Assessment/Plan

## 2013-04-28 NOTE — Assessment & Plan Note (Signed)
Continue Cialis 10mg  prn for ED since this seems to be working for patient. I refilled this medication today.

## 2013-04-28 NOTE — Assessment & Plan Note (Signed)
BP Readings from Last 3 Encounters:  04/28/13 133/91  01/25/13 147/93  09/21/12 131/84    Lab Results  Component Value Date   NA 139 08/29/2012   K 4.5 08/29/2012   CREATININE 1.00 08/29/2012    Assessment: Blood pressure control:  good Progress toward BP goal:   at goal Comments:   Plan: Medications:  continue current medications Educational resources provided:   Self management tools provided:   Other plans: continue HCTZ 25mg  daily

## 2013-04-29 ENCOUNTER — Telehealth: Payer: Self-pay | Admitting: *Deleted

## 2013-04-29 NOTE — Telephone Encounter (Signed)
Call from pt's significant other - states Dr Darci Needle requested med pt is taking - it is Terazosin 1mg  at bedtime.

## 2013-05-04 NOTE — Progress Notes (Signed)
I saw and evaluated the patient.  I personally confirmed the key portions of the history and exam documented by Dr. Chikowski and I reviewed pertinent patient test results.  The assessment, diagnosis, and plan were formulated together and I agree with the documentation in the resident's note. 

## 2013-11-22 ENCOUNTER — Encounter (HOSPITAL_COMMUNITY): Payer: Self-pay | Admitting: Emergency Medicine

## 2013-11-22 ENCOUNTER — Emergency Department (HOSPITAL_COMMUNITY)
Admission: EM | Admit: 2013-11-22 | Discharge: 2013-11-22 | Disposition: A | Discharge status: Home / Self Care | Payer: Non-veteran care | Attending: Emergency Medicine | Admitting: Emergency Medicine

## 2013-11-22 DIAGNOSIS — E785 Hyperlipidemia, unspecified: Secondary | ICD-10-CM | POA: Insufficient documentation

## 2013-11-22 DIAGNOSIS — I10 Essential (primary) hypertension: Secondary | ICD-10-CM | POA: Insufficient documentation

## 2013-11-22 DIAGNOSIS — Z79899 Other long term (current) drug therapy: Secondary | ICD-10-CM | POA: Insufficient documentation

## 2013-11-22 DIAGNOSIS — Z87891 Personal history of nicotine dependence: Secondary | ICD-10-CM | POA: Insufficient documentation

## 2013-11-22 DIAGNOSIS — R599 Enlarged lymph nodes, unspecified: Secondary | ICD-10-CM | POA: Insufficient documentation

## 2013-11-22 DIAGNOSIS — B029 Zoster without complications: Secondary | ICD-10-CM | POA: Insufficient documentation

## 2013-11-22 MED ORDER — HYDROCODONE-ACETAMINOPHEN 5-325 MG PO TABS: 1.0000 | ORAL_TABLET | ORAL | Status: DC | PRN

## 2013-11-22 MED ORDER — PREDNISONE 20 MG PO TABS
60.0000 mg | ORAL_TABLET | Freq: Once | ORAL | Status: AC
  Administered 2013-11-22: 60 mg via ORAL
  Filled 2013-11-22: qty 3

## 2013-11-22 MED ORDER — VALACYCLOVIR HCL 1 G PO TABS: 1000.0000 mg | ORAL_TABLET | Freq: Three times a day (TID) | ORAL | Status: DC

## 2013-11-22 MED ORDER — VALACYCLOVIR HCL 500 MG PO TABS
1000.0000 mg | ORAL_TABLET | Freq: Once | ORAL | Status: AC
  Administered 2013-11-22: 1000 mg via ORAL
  Filled 2013-11-22: qty 2

## 2013-11-22 NOTE — Discharge Instructions (Signed)
Shingles Shingles (herpes zoster) is an infection that is caused by the same virus that causes chickenpox (varicella). The infection causes a painful skin rash and fluid-filled blisters, which eventually break open, crust over, and heal. It may occur in any area of the body, but it usually affects only one side of the body or face. The pain of shingles usually lasts about 1 month. However, some people with shingles may develop long-term (chronic) pain in the affected area of the body. Shingles often occurs many years after the person had chickenpox. It is more common:  In people older than 50 years.  In people with weakened immune systems, such as those with HIV, AIDS, or cancer.  In people taking medicines that weaken the immune system, such as transplant medicines.  In people under great stress. CAUSES  Shingles is caused by the varicella zoster virus (VZV), which also causes chickenpox. After a person is infected with the virus, it can remain in the person's body for years in an inactive state (dormant). To cause shingles, the virus reactivates and breaks out as an infection in a nerve root. The virus can be spread from person to person (contagious) through contact with open blisters of the shingles rash. It will only spread to people who have not had chickenpox. When these people are exposed to the virus, they may develop chickenpox. They will not develop shingles. Once the blisters scab over, the person is no longer contagious and cannot spread the virus to others. SYMPTOMS  Shingles shows up in stages. The initial symptoms may be pain, itching, and tingling in an area of the skin. This pain is usually described as burning, stabbing, or throbbing.In a few days or weeks, a painful red rash will appear in the area where the pain, itching, and tingling were felt. The rash is usually on one side of the body in a band or belt-like pattern. Then, the rash usually turns into fluid-filled blisters. They  will scab over and dry up in approximately 2 3 weeks. Flu-like symptoms may also occur with the initial symptoms, the rash, or the blisters. These may include:  Fever.  Chills.  Headache.  Upset stomach. DIAGNOSIS  Your caregiver will perform a skin exam to diagnose shingles. Skin scrapings or fluid samples may also be taken from the blisters. This sample will be examined under a microscope or sent to a lab for further testing. TREATMENT  There is no specific cure for shingles. Your caregiver will likely prescribe medicines to help you manage the pain, recover faster, and avoid long-term problems. This may include antiviral drugs, anti-inflammatory drugs, and pain medicines. HOME CARE INSTRUCTIONS   Take a cool bath or apply cool compresses to the area of the rash or blisters as directed. This may help with the pain and itching.   Only take over-the-counter or prescription medicines as directed by your caregiver.   Rest as directed by your caregiver.  Keep your rash and blisters clean with mild soap and cool water or as directed by your caregiver.  Do not pick your blisters or scratch your rash. Apply an anti-itch cream or numbing creams to the affected area as directed by your caregiver.  Keep your shingles rash covered with a loose bandage (dressing).  Avoid skin contact with:  Babies.   Pregnant women.   Children with eczema.   Elderly people with transplants.   People with chronic illnesses, such as leukemia or AIDS.   Wear loose-fitting clothing to help ease   the pain of material rubbing against the rash.  Keep all follow-up appointments with your caregiver.If the area involved is on your face, you may receive a referral for follow-up to a specialist, such as an eye doctor (ophthalmologist) or an ear, nose, and throat (ENT) doctor. Keeping all follow-up appointments will help you avoid eye complications, chronic pain, or disability.  SEEK IMMEDIATE MEDICAL  CARE IF:   You have facial pain, pain around the eye area, or loss of feeling on one side of your face.  You have ear pain or ringing in your ear.  You have loss of taste.  Your pain is not relieved with prescribed medicines.   Your redness or swelling spreads.   You have more pain and swelling.  Your condition is worsening or has changed.   You have a feveror persistent symptoms for more than 2 3 days.  You have a fever and your symptoms suddenly get worse. MAKE SURE YOU:  Understand these instructions.  Will watch your condition.  Will get help right away if you are not doing well or get worse. Document Released: 05/26/2005 Document Revised: 02/18/2012 Document Reviewed: 01/08/2012 [employer] Patient Information 2014 [employer].  

## 2013-11-22 NOTE — ED Notes (Signed)
Pt c/o painful rash from anus to L thigh x 3-4 days, denies rectal bleeding. Also c/o L groin tenderness with swollen lymph node.

## 2013-11-22 NOTE — ED Provider Notes (Signed)
CSN: [id number]     Arrival date & time 11/22/13  2052 History   First MD Initiated Contact with Patient 11/22/13 2122     Chief Complaint  Patient presents with  . Rash  . Lymphadenopathy      HPI  Patient patient presents with a painful rash in the posterior aspect of his left leg and pain in his penis. States it stings and burns when he wipes. These were swelling in his left groin over last several days as well. No dysuria no scrotal or penile rash discharge dysuria or other symptoms.  Past Medical History  Diagnosis Date  . Chest pain   . Headache(784.0)   . Hypertension   . Hyperlipidemia   . Gonorrhea     remote h/o gonorrhea which was treated as per patient   Past Surgical History  Procedure Laterality Date  . Tonsilectomy, adenoidectomy, bilateral myringotomy and tubes  1959   Family History  Problem Relation Age of Onset  . Diabetes      Family history   History  Substance Use Topics  . Smoking status: Former Smoker    Types: Cigarettes    Quit date: 06/09/1993  . Smokeless tobacco: Not on file  . Alcohol Use: No    Review of Systems  Constitutional: Negative for fever, chills, diaphoresis, appetite change and fatigue.  HENT: Negative for mouth sores, sore throat and trouble swallowing.   Eyes: Negative for visual disturbance.  Respiratory: Negative for cough, chest tightness, shortness of breath and wheezing.   Cardiovascular: Negative for chest pain.  Gastrointestinal: Negative for nausea, vomiting, abdominal pain, diarrhea and abdominal distention.  Endocrine: Negative for polydipsia, polyphagia and polyuria.  Genitourinary: Negative for dysuria, frequency and hematuria.  Musculoskeletal: Negative for gait problem.  Skin: Negative for color change, pallor and rash.       Rash in his left leg  Neurological: Negative for dizziness, syncope, light-headedness and headaches.  Hematological: Positive for adenopathy. Does not bruise/bleed easily.       Left  groin  Psychiatric/Behavioral: Negative for behavioral problems and confusion.      Allergies  Review of patient's allergies indicates no known allergies.  Home Medications   Prior to Admission medications   Medication Sig Start Date End Date Taking? Authorizing Provider  aspirin 325 MG EC tablet Take 325 mg by mouth every 6 (six) hours as needed for pain.    Historical Provider, MD  hydrochlorothiazide (HYDRODIURIL) 25 MG tablet Take 1 tablet (25 mg total) by mouth daily. 09/02/12   Trish Fountain, MD  HYDROcodone-acetaminophen (NORCO/VICODIN) 5-325 MG per tablet Take 1 tablet by mouth every 4 (four) hours as needed. 11/22/13   Tanna Furry, MD  loperamide (IMODIUM) 2 MG capsule Take 1 capsule (2 mg total) by mouth 4 (four) times daily as needed for diarrhea or loose stools. 01/25/13   Ivin Poot, MD  promethazine (PHENERGAN) 12.5 MG tablet Take 1 tablet (12.5 mg total) by mouth every 6 (six) hours as needed for nausea. 01/25/13   Ivin Poot, MD  tadalafil (CIALIS) 10 MG tablet Take 1 tablet (10 mg total) by mouth as needed for erectile dysfunction. 04/28/13 04/28/14  Rebecca Eaton, MD  valACYclovir (VALTREX) 1000 MG tablet Take 1 tablet (1,000 mg total) by mouth 3 (three) times daily. 11/22/13   Tanna Furry, MD   BP 148/79  Pulse 93  Temp(Src) 99.2 F (37.3 C) (Oral)  Resp 18  Ht 5\' 11"  (1.803 m)  Wt 160 lb (72.576 kg)  BMI 22.33 kg/m2  SpO2 97% Physical Exam  Constitutional: He is oriented to person, place, and time. He appears well-developed and well-nourished. No distress.  HENT:  Head: Normocephalic.  Eyes: Conjunctivae are normal. Pupils are equal, round, and reactive to light. No scleral icterus.  Neck: Normal range of motion. Neck supple. No thyromegaly present.  Cardiovascular: Normal rate and regular rhythm.  Exam reveals no gallop and no friction rub.   No murmur heard. Pulmonary/Chest: Effort normal and breath sounds normal. No respiratory distress. He has no  wheezes. He has no rales.  Abdominal: Soft. Bowel sounds are normal. He exhibits no distension. There is no tenderness. There is no rebound.  Musculoskeletal: Normal range of motion.  Lymphadenopathy:       Left: Inguinal adenopathy present.  Palpable lymphadenopathy in the left groin. No palpable hernia. Nonfluctuant. Minimally tender.  Neurological: He is alert and oriented to person, place, and time.  Skin: Skin is warm and dry. No rash noted.     Psychiatric: He has a normal mood and affect. His behavior is normal.    ED Course  Procedures (including critical care time) Labs Review Labs Reviewed - No data to display  Imaging Review No results found.   EKG Interpretation None      MDM   Final diagnoses:  Shingles    Pt with Lt Sacral Dermatomal zoster and reactive Lt inguinal lymphadenopathy.  Plan is valtrex, prn vicoden, and single dose prednisone tonight to hopefully thwart post-herpetic neuralgia.  Tanna Furry, MD 11/22/13 2136

## 2014-01-28 ENCOUNTER — Emergency Department (HOSPITAL_COMMUNITY): Payer: Non-veteran care

## 2014-01-28 ENCOUNTER — Emergency Department (HOSPITAL_COMMUNITY)
Admission: EM | Admit: 2014-01-28 | Discharge: 2014-01-29 | Disposition: A | Discharge status: Home / Self Care | Payer: Non-veteran care | Attending: Emergency Medicine | Admitting: Emergency Medicine

## 2014-01-28 ENCOUNTER — Encounter (HOSPITAL_COMMUNITY): Payer: Self-pay | Admitting: Emergency Medicine

## 2014-01-28 DIAGNOSIS — IMO0002 Reserved for concepts with insufficient information to code with codable children: Secondary | ICD-10-CM | POA: Insufficient documentation

## 2014-01-28 DIAGNOSIS — S20219A Contusion of unspecified front wall of thorax, initial encounter: Secondary | ICD-10-CM | POA: Insufficient documentation

## 2014-01-28 DIAGNOSIS — Z7982 Long term (current) use of aspirin: Secondary | ICD-10-CM | POA: Insufficient documentation

## 2014-01-28 DIAGNOSIS — Y9389 Activity, other specified: Secondary | ICD-10-CM | POA: Insufficient documentation

## 2014-01-28 DIAGNOSIS — Y9289 Other specified places as the place of occurrence of the external cause: Secondary | ICD-10-CM | POA: Insufficient documentation

## 2014-01-28 DIAGNOSIS — E785 Hyperlipidemia, unspecified: Secondary | ICD-10-CM | POA: Insufficient documentation

## 2014-01-28 DIAGNOSIS — S20212A Contusion of left front wall of thorax, initial encounter: Secondary | ICD-10-CM

## 2014-01-28 DIAGNOSIS — S298XXA Other specified injuries of thorax, initial encounter: Secondary | ICD-10-CM | POA: Insufficient documentation

## 2014-01-28 DIAGNOSIS — I10 Essential (primary) hypertension: Secondary | ICD-10-CM | POA: Insufficient documentation

## 2014-01-28 DIAGNOSIS — Z87891 Personal history of nicotine dependence: Secondary | ICD-10-CM | POA: Insufficient documentation

## 2014-01-28 DIAGNOSIS — Z8619 Personal history of other infectious and parasitic diseases: Secondary | ICD-10-CM | POA: Insufficient documentation

## 2014-01-28 LAB — COMPREHENSIVE METABOLIC PANEL
ALBUMIN: 3.9 g/dL (ref 3.5–5.2)
ALK PHOS: 62 U/L (ref 39–117)
ALT: 40 U/L (ref 0–53)
ANION GAP: 9 (ref 5–15)
AST: 33 U/L (ref 0–37)
BUN: 9 mg/dL (ref 6–23)
CO2: 32 mEq/L (ref 19–32)
Calcium: 9.1 mg/dL (ref 8.4–10.5)
Chloride: 100 mEq/L (ref 96–112)
Creatinine, Ser: 0.94 mg/dL (ref 0.50–1.35)
GFR calc Af Amer: >90 mL/min (ref >90)
GFR calc non Af Amer: 90 mL/min — ABNORMAL LOW (ref >90)
Glucose, Bld: 114 mg/dL — ABNORMAL HIGH (ref 70–99)
POTASSIUM: 4.3 meq/L (ref 3.7–5.3)
Sodium: 141 mEq/L (ref 137–147)
TOTAL PROTEIN: 7 g/dL (ref 6.0–8.3)
Total Bilirubin: 0.8 mg/dL (ref 0.3–1.2)

## 2014-01-28 LAB — CBC
HEMATOCRIT: 43 % (ref 39.0–52.0)
Hemoglobin: 14.5 g/dL (ref 13.0–17.0)
MCH: 29.5 pg (ref 26.0–34.0)
MCHC: 33.7 g/dL (ref 30.0–36.0)
MCV: 87.4 fL (ref 78.0–100.0)
PLATELETS: 234 10*3/uL (ref 150–400)
RBC: 4.92 MIL/uL (ref 4.22–5.81)
RDW: 11.9 % (ref 11.5–15.5)
WBC: 9.3 10*3/uL (ref 4.0–10.5)

## 2014-01-28 LAB — I-STAT TROPONIN, ED: TROPONIN I, POC: 0 ng/mL (ref 0.00–0.08)
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
## 2014-01-29 MED ORDER — KETOROLAC TROMETHAMINE 60 MG/2ML IM SOLN
60.0000 mg | Freq: Once | INTRAMUSCULAR | Status: AC
  Administered 2014-01-29: 60 mg via INTRAMUSCULAR
  Filled 2014-01-29: qty 2

## 2014-01-29 MED ORDER — NAPROXEN 500 MG PO TABS: 500.0000 mg | ORAL_TABLET | Freq: Two times a day (BID) | ORAL | Status: DC

## 2014-01-29 NOTE — Discharge Instructions (Signed)
Take Naprosyn as needed for pain. Apply ice to your injury. Refer to attached documents for more information.

## 2014-01-29 NOTE — ED Notes (Signed)
On assessment pain reproducible when pressed on patient chest.

## 2014-01-29 NOTE — ED Provider Notes (Signed)
Medical screening examination/treatment/procedure(s) were performed by non-physician practitioner and as supervising physician I was immediately available for consultation/collaboration.   EKG Interpretation   Date/Time:  Saturday January 28 2014 22:11:06 EDT Ventricular Rate:  64 PR Interval:  174 QRS Duration: 94 QT Interval:  421 QTC Calculation: 434 R Axis:   -18 Text Interpretation:  Sinus rhythm Probable left atrial enlargement  Borderline left axis deviation No significant change since last tracing  Confirmed by [facility]  MD, Jenny Reichmann ([id number]) on 01/28/2014 10:23:25 PM        Hoy Morn, MD 01/29/14 [phone]
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
## 2015-07-26 ENCOUNTER — Emergency Department (HOSPITAL_COMMUNITY)
Admission: EM | Admit: 2015-07-26 | Discharge: 2015-07-26 | Disposition: A | Discharge status: Home / Self Care | Payer: Non-veteran care | Attending: Emergency Medicine | Admitting: Emergency Medicine

## 2015-07-26 ENCOUNTER — Encounter (HOSPITAL_COMMUNITY): Payer: Self-pay

## 2015-07-26 DIAGNOSIS — T401X1A Poisoning by heroin, accidental (unintentional), initial encounter: Secondary | ICD-10-CM | POA: Insufficient documentation

## 2015-07-26 DIAGNOSIS — Y9289 Other specified places as the place of occurrence of the external cause: Secondary | ICD-10-CM | POA: Insufficient documentation

## 2015-07-26 DIAGNOSIS — I10 Essential (primary) hypertension: Secondary | ICD-10-CM | POA: Diagnosis not present

## 2015-07-26 DIAGNOSIS — Y998 Other external cause status: Secondary | ICD-10-CM | POA: Insufficient documentation

## 2015-07-26 DIAGNOSIS — Y9389 Activity, other specified: Secondary | ICD-10-CM | POA: Diagnosis not present

## 2015-07-26 NOTE — ED Notes (Signed)
Went in to question wife who has arrived to room.  Pt and wife not found in room.  Agricultural consultant and security notified.  Pt does have IV in place.  Staff attempting to locate patient.  Pt was not a suicidal ideation patient.

## 2015-07-26 NOTE — ED Notes (Signed)
Per EMS, pt here with heroine overdose.  Pt from convenient store.  Bystander called EMS.  Pt now alert.  Pt given narcan and zofran in route.  IV 18g LAC.  Pt gcs initially 3.  Vitals: 150/56, hr 86, resp 16 gcs 15, cbg 159

## 2015-07-26 NOTE — ED Notes (Signed)
Could not find pt.  Charge and security aware

## 2016-02-18 ENCOUNTER — Encounter (HOSPITAL_COMMUNITY): Payer: Self-pay | Admitting: Emergency Medicine

## 2016-02-18 ENCOUNTER — Emergency Department (HOSPITAL_COMMUNITY)
Admission: EM | Admit: 2016-02-18 | Discharge: 2016-02-18 | Disposition: A | Discharge status: Home / Self Care | Payer: Non-veteran care | Attending: Emergency Medicine | Admitting: Emergency Medicine

## 2016-02-18 DIAGNOSIS — Z7982 Long term (current) use of aspirin: Secondary | ICD-10-CM | POA: Insufficient documentation

## 2016-02-18 DIAGNOSIS — Z79899 Other long term (current) drug therapy: Secondary | ICD-10-CM | POA: Diagnosis not present

## 2016-02-18 DIAGNOSIS — Z87891 Personal history of nicotine dependence: Secondary | ICD-10-CM | POA: Diagnosis not present

## 2016-02-18 DIAGNOSIS — S6992XA Unspecified injury of left wrist, hand and finger(s), initial encounter: Secondary | ICD-10-CM | POA: Diagnosis present

## 2016-02-18 DIAGNOSIS — I10 Essential (primary) hypertension: Secondary | ICD-10-CM | POA: Insufficient documentation

## 2016-02-18 DIAGNOSIS — S61211A Laceration without foreign body of left index finger without damage to nail, initial encounter: Secondary | ICD-10-CM | POA: Diagnosis not present

## 2016-02-18 DIAGNOSIS — Y999 Unspecified external cause status: Secondary | ICD-10-CM | POA: Diagnosis not present

## 2016-02-18 DIAGNOSIS — Y939 Activity, unspecified: Secondary | ICD-10-CM | POA: Diagnosis not present

## 2016-02-18 DIAGNOSIS — W293XXA Contact with powered garden and outdoor hand tools and machinery, initial encounter: Secondary | ICD-10-CM | POA: Insufficient documentation

## 2016-02-18 DIAGNOSIS — Y929 Unspecified place or not applicable: Secondary | ICD-10-CM | POA: Diagnosis not present

## 2016-02-18 DIAGNOSIS — S61219A Laceration without foreign body of unspecified finger without damage to nail, initial encounter: Secondary | ICD-10-CM

## 2016-02-18 MED ORDER — TETANUS-DIPHTH-ACELL PERTUSSIS 5-2.5-18.5 LF-MCG/0.5 IM SUSP
0.5000 mL | Freq: Once | INTRAMUSCULAR | Status: AC
  Administered 2016-02-18: 0.5 mL via INTRAMUSCULAR
  Filled 2016-02-18: qty 0.5

## 2016-02-18 MED ORDER — LIDOCAINE HCL 1 % IJ SOLN
INTRAMUSCULAR | Status: AC
  Administered 2016-02-18: 15:00:00
  Filled 2016-02-18: qty 20

## 2016-02-18 MED ORDER — LIDOCAINE HCL (PF) 1 % IJ SOLN: 10.0000 mL | Freq: Once | INTRAMUSCULAR | Status: DC

## 2016-02-18 NOTE — ED Triage Notes (Signed)
Pt was trimming hedges and managed to cut his L palm. Bleeding controlled at this time. A&Ox4 and ambulatory. Denies numbness and tingling, full ROM in hand.

## 2016-02-18 NOTE — Discharge Instructions (Signed)
Take your dressing off tomorrow.  If at any point the dressing feels too tight or you develop numbness in your finger than take the dressing off.

## 2016-02-18 NOTE — ED Provider Notes (Signed)
[place] DEPT Provider Note   CSN: [id number] Arrival date & time: 02/18/16  1254  By signing my name below, I, Irene Pap, attest that this documentation has been prepared under the direction and in the presence of [employer], PA-C. Electronically Signed: Irene Pap, ED Scribe. 02/18/16. 1:53 PM.  History   Chief Complaint Chief Complaint  Patient presents with  . Extremity Laceration   The history is provided by the patient. No language interpreter was used.   HPI Comments: Sean Shannon is a 61 y.o. Male with a hx of HTN who presents to the Emergency Department complaining of a laceration to the palmar aspect of the left hand onset one hour ago. Pt was trimming his hedges outside and cut his hand with the blade. Bleeding is currently controlled. He denies color change, numbness, or weakness. Pt is not utd on tdap.   Past Medical History:  Diagnosis Date  . Chest pain   . Gonorrhea    remote h/o gonorrhea which was treated as per patient  . Headache(784.0)   . Hyperlipidemia   . Hypertension     Patient Active Problem List   Diagnosis Date Noted  . Erectile dysfunction 09/02/2012  . Preventive measure 11/20/2010  . Hepatitis C antibody test positive 10/29/2010  . SUBSTANCE ABUSE, MULTIPLE 12/12/2009  . BENIGN PROSTATIC HYPERTROPHY, HX OF 08/02/2009  . HYPERLIPIDEMIA 09/24/2006  . ALLERGIC RHINITIS, SEASONAL 09/24/2006  . HYPERTENSION 09/10/2006  . TB SKIN TEST, POSITIVE, HX OF 09/10/2006    Past Surgical History:  Procedure Laterality Date  . SPINAL FIXATION SURGERY    . TONSILECTOMY, ADENOIDECTOMY, BILATERAL MYRINGOTOMY AND TUBES  1959    Home Medications    Prior to Admission medications   Medication Sig Start Date End Date Taking? Authorizing Provider  aspirin 325 MG EC tablet Take 325 mg by mouth every 6 (six) hours as needed for pain.    Historical Provider, MD  hydrochlorothiazide (HYDRODIURIL) 25 MG tablet Take 1 tablet (25 mg total)  by mouth daily. 09/02/12   Trish Fountain, MD  naproxen (NAPROSYN) 500 MG tablet Take 1 tablet (500 mg total) by mouth 2 (two) times daily with a meal. 01/29/14   Alvina Chou, PA-C    Family History Family History  Problem Relation Age of Onset  . Diabetes      Family history    Social History Social History  Substance Use Topics  . Smoking status: Former Smoker    Types: Cigarettes    Quit date: 06/09/1993  . Smokeless tobacco: Not on file  . Alcohol use No     Allergies   Review of patient's allergies indicates no known allergies.   Review of Systems Review of Systems  Skin: Positive for wound. Negative for color change.  Neurological: Negative for weakness and numbness.    A complete 10 system review of systems was obtained and all systems are negative except as noted in the HPI and PMH.   Physical Exam Updated Vital Signs BP 125/72 (BP Location: Left Arm)   Pulse 80   Temp 97.8 F (36.6 C) (Oral)   Resp 16   SpO2 98%   Physical Exam  Constitutional: He is oriented to person, place, and time. He appears well-developed and well-nourished.  HENT:  Head: Normocephalic and atraumatic.  Eyes: Conjunctivae and EOM are normal. Pupils are equal, round, and reactive to light.  Neck: Normal range of motion. Neck supple.  Cardiovascular: Normal rate and regular rhythm.   Pulmonary/Chest:  Effort normal.  Musculoskeletal: Normal range of motion.  Full ROM at MCP, DIP, and PIP of the left index finger. 1.5 cm flap laceration to the palmar aspect of the left index finger between the PIP and MCP, bleeding is controlled. Distal sensation of the left index finger is intact.   Neurological: He is alert and oriented to person, place, and time.  Skin: Skin is warm and dry.  Psychiatric: He has a normal mood and affect. His behavior is normal.  Nursing note and vitals reviewed.    ED Treatments / Results  DIAGNOSTIC STUDIES: Oxygen Saturation is 98% on RA, normal by  my interpretation.    COORDINATION OF CARE: 1:51 PM-Discussed treatment plan which includes laceration repair with pt at bedside and pt agreed to plan.    Labs (all labs ordered are listed, but only abnormal results are displayed) Labs Reviewed - No data to display  EKG  EKG Interpretation None       Radiology No results found.  Procedures .Marland KitchenLaceration Repair Date/Time: 02/18/2016 1:53 PM Performed by: Hyman Bible Authorized by: Hyman Bible   Consent:    Consent obtained:  Verbal   Consent given by:  Patient Anesthesia (see MAR for exact dosages):    Anesthesia method:  Local infiltration   Local anesthetic:  Lidocaine 1% w/o epi Laceration details:    Location:  Finger   Finger location:  L index finger   Length (cm):  1.5 Repair type:    Repair type:  Simple Pre-procedure details:    Preparation:  Patient was prepped and draped in usual sterile fashion Exploration:    Hemostasis achieved with:  Direct pressure Treatment:    Amount of cleaning:  Standard   Irrigation solution:  Sterile saline Skin repair:    Repair method:  Sutures   Suture size:  4-0   Suture material:  Prolene   Suture technique:  Simple interrupted Post-procedure details:    Patient tolerance of procedure:  Tolerated well, no immediate complications Comments:     Performed by PA student Leda Quail under my supervision     (including critical care time)  Medications Ordered in ED Medications  lidocaine (PF) (XYLOCAINE) 1 % injection 10 mL (not administered)  Tdap (BOOSTRIX) injection 0.5 mL (not administered)     Initial Impression / Assessment and Plan / ED Course  I have reviewed the triage vital signs and the nursing notes.  Pertinent labs & imaging results that were available during my care of the patient were reviewed by me and considered in my medical decision making (see chart for details).  Clinical Course   Patient presents today with a small flap laceration  to the palmar aspect of the finger.  No tendon involvement.  Full ROM of the finger.  Neurovascularly intact.   Laceration repaired without difficulty.  Tetanus updated.  Patient stable for discharge.  Return precautions given.    Final Clinical Impressions(s) / ED Diagnoses   Tdap booster given.  Irrigation performed. Laceration occurred < 8 hours prior to repair which was well tolerated. Pt has no co morbidities to effect normal wound healing. Discussed suture home care w pt and answered questions. Pt to f-u for wound check and suture removal in 7 days. Pt is hemodynamically stable w no complaints prior to dc.    Final diagnoses:  None  I personally performed the services described in this documentation, which was scribed in my presence. The recorded information has been reviewed and is accurate.  New Prescriptions New Prescriptions   No medications on file     Hyman Bible, PA-C 02/18/16 [place], MD 02/18/16 1733

## 2016-02-26 ENCOUNTER — Emergency Department (HOSPITAL_COMMUNITY)
Admission: EM | Admit: 2016-02-26 | Discharge: 2016-02-26 | Disposition: A | Discharge status: Home / Self Care | Payer: Non-veteran care | Attending: Emergency Medicine | Admitting: Emergency Medicine

## 2016-02-26 ENCOUNTER — Encounter (HOSPITAL_COMMUNITY): Payer: Self-pay | Admitting: Emergency Medicine

## 2016-02-26 DIAGNOSIS — Z4802 Encounter for removal of sutures: Secondary | ICD-10-CM | POA: Diagnosis not present

## 2016-02-26 DIAGNOSIS — S61211D Laceration without foreign body of left index finger without damage to nail, subsequent encounter: Secondary | ICD-10-CM | POA: Insufficient documentation

## 2016-02-26 DIAGNOSIS — Z7982 Long term (current) use of aspirin: Secondary | ICD-10-CM | POA: Diagnosis not present

## 2016-02-26 DIAGNOSIS — Z87891 Personal history of nicotine dependence: Secondary | ICD-10-CM | POA: Diagnosis not present

## 2016-02-26 DIAGNOSIS — I10 Essential (primary) hypertension: Secondary | ICD-10-CM | POA: Diagnosis not present

## 2016-02-26 DIAGNOSIS — Z79899 Other long term (current) drug therapy: Secondary | ICD-10-CM | POA: Insufficient documentation

## 2016-02-26 DIAGNOSIS — X58XXXD Exposure to other specified factors, subsequent encounter: Secondary | ICD-10-CM | POA: Diagnosis not present

## 2016-02-26 NOTE — ED Provider Notes (Signed)
[facility] DEPT Provider Note   CSN: [id number] Arrival date & time: 02/26/16  1540     History   Chief Complaint Chief Complaint  Patient presents with  . Suture / Staple Removal    HPI Sean Shannon is a 60 y.o. male.  HPI   Patient presents for suture removal.  4 sutures placed 8 days ago in left index finger.  Denies fever, chills, drainage, or redness.  Past Medical History:  Diagnosis Date  . Chest pain   . Gonorrhea    remote h/o gonorrhea which was treated as per patient  . Headache(784.0)   . Hyperlipidemia   . Hypertension     Patient Active Problem List   Diagnosis Date Noted  . Erectile dysfunction 09/02/2012  . Preventive measure 11/20/2010  . Hepatitis C antibody test positive 10/29/2010  . SUBSTANCE ABUSE, MULTIPLE 12/12/2009  . BENIGN PROSTATIC HYPERTROPHY, HX OF 08/02/2009  . HYPERLIPIDEMIA 09/24/2006  . ALLERGIC RHINITIS, SEASONAL 09/24/2006  . HYPERTENSION 09/10/2006  . TB SKIN TEST, POSITIVE, HX OF 09/10/2006    Past Surgical History:  Procedure Laterality Date  . SPINAL FIXATION SURGERY    . TONSILECTOMY, ADENOIDECTOMY, BILATERAL MYRINGOTOMY AND TUBES  1959       Home Medications    Prior to Admission medications   Medication Sig Start Date End Date Taking? Authorizing Provider  aspirin 325 MG EC tablet Take 325 mg by mouth every 6 (six) hours as needed for pain.    Historical Provider, MD  hydrochlorothiazide (HYDRODIURIL) 25 MG tablet Take 1 tablet (25 mg total) by mouth daily. 09/02/12   Trish Fountain, MD  naproxen (NAPROSYN) 500 MG tablet Take 1 tablet (500 mg total) by mouth 2 (two) times daily with a meal. 01/29/14   Alvina Chou, PA-C    Family History Family History  Problem Relation Age of Onset  . Diabetes      Family history    Social History Social History  Substance Use Topics  . Smoking status: Former Smoker    Types: Cigarettes    Quit date: 06/09/1993  . Smokeless tobacco: Not on file  .  Alcohol use No     Allergies   Review of patient's allergies indicates no known allergies.   Review of Systems Review of Systems All other systems negative unless otherwise stated in HPI   Physical Exam Updated Vital Signs BP 101/79 (BP Location: Left Arm)   Pulse 65   Temp 98.1 F (36.7 C) (Oral)   Resp 16   SpO2 100%   Physical Exam  Constitutional: He is oriented to person, place, and time. He appears well-developed and well-nourished.  HENT:  Head: Normocephalic and atraumatic.  Right Ear: External ear normal.  Left Ear: External ear normal.  Eyes: Conjunctivae are normal. No scleral icterus.  Neck: No tracheal deviation present.  Pulmonary/Chest: Effort normal. No respiratory distress.  Abdominal: He exhibits no distension.  Musculoskeletal: Normal range of motion.  Neurological: He is alert and oriented to person, place, and time.  Skin: Skin is warm and dry.  Well healing laceration without infection to left index finger.  4 sutures in place.   Psychiatric: He has a normal mood and affect. His behavior is normal.     ED Treatments / Results  Labs (all labs ordered are listed, but only abnormal results are displayed) Labs Reviewed - No data to display  EKG  EKG Interpretation None       Radiology No results found.  Procedures Procedures (including critical care time)  SUTURE REMOVAL Performed by: Gloriann Loan  Consent: Verbal consent obtained. Patient identity confirmed: provided demographic data Time out: Immediately prior to procedure a "time out" was called to verify the correct patient, procedure, equipment, support staff and site/side marked as required.  Location details: left index finger  Wound Appearance: clean  Sutures/Staples Removed: 4  Facility: sutures placed in this facility Patient tolerance: Patient tolerated the procedure well with no immediate complications.    Medications Ordered in ED Medications - No data to  display   Initial Impression / Assessment and Plan / ED Course  I have reviewed the triage vital signs and the nursing notes.  Pertinent labs & imaging results that were available during my care of the patient were reviewed by me and considered in my medical decision making (see chart for details).  Clinical Course    Pt to ER for suture removal and wound check as above. Procedure tolerated well. Vitals normal, no signs of infection. Scar minimization & return precautions given at dc.     Final Clinical Impressions(s) / ED Diagnoses   Final diagnoses:  Visit for suture removal    New Prescriptions New Prescriptions   No medications on file     Gloriann Loan, Hershal Coria 02/26/16 1626    Leo Grosser, MD 02/27/16 1308

## 2016-02-26 NOTE — ED Triage Notes (Signed)
Pt here for removal of about 5 sutures to left index finger placed here 7 days ago. No complications.

## 2016-03-27 ENCOUNTER — Telehealth: Payer: Self-pay

## 2016-03-27 ENCOUNTER — Other Ambulatory Visit: Payer: Self-pay

## 2016-03-27 DIAGNOSIS — K838 Other specified diseases of biliary tract: Secondary | ICD-10-CM

## 2016-03-27 DIAGNOSIS — K8689 Other specified diseases of pancreas: Secondary | ICD-10-CM

## 2016-03-27 NOTE — Telephone Encounter (Signed)
Per [facility] request for EUS scheduled for 04/03/16 930 am [facility]  Instructions mailed to the home, no answer and no voice mail

## 2016-03-28 NOTE — Telephone Encounter (Signed)
EUS scheduled, pt instructed and medications reviewed.  Patient instructions mailed to home.  Patient to call with any questions or concerns.  

## 2016-03-31 ENCOUNTER — Encounter (HOSPITAL_COMMUNITY): Payer: Self-pay | Admitting: *Deleted

## 2016-04-03 ENCOUNTER — Ambulatory Visit (HOSPITAL_COMMUNITY): Payer: Non-veteran care | Admitting: Certified Registered Nurse Anesthetist

## 2016-04-03 ENCOUNTER — Encounter (HOSPITAL_COMMUNITY): Payer: Self-pay | Admitting: Certified Registered Nurse Anesthetist

## 2016-04-03 ENCOUNTER — Encounter (HOSPITAL_COMMUNITY)
Admission: RE | Disposition: A | Discharge status: Home / Self Care | Payer: Self-pay | Source: Ambulatory Visit | Attending: Gastroenterology

## 2016-04-03 ENCOUNTER — Ambulatory Visit (HOSPITAL_COMMUNITY)
Admission: RE | Admit: 2016-04-03 | Discharge: 2016-04-03 | Disposition: A | Discharge status: Home / Self Care | Payer: Non-veteran care | Source: Ambulatory Visit | Attending: Gastroenterology | Admitting: Gastroenterology

## 2016-04-03 DIAGNOSIS — Z791 Long term (current) use of non-steroidal anti-inflammatories (NSAID): Secondary | ICD-10-CM | POA: Insufficient documentation

## 2016-04-03 DIAGNOSIS — Z87891 Personal history of nicotine dependence: Secondary | ICD-10-CM | POA: Diagnosis not present

## 2016-04-03 DIAGNOSIS — Z7982 Long term (current) use of aspirin: Secondary | ICD-10-CM | POA: Insufficient documentation

## 2016-04-03 DIAGNOSIS — I10 Essential (primary) hypertension: Secondary | ICD-10-CM | POA: Diagnosis not present

## 2016-04-03 DIAGNOSIS — K838 Other specified diseases of biliary tract: Secondary | ICD-10-CM | POA: Diagnosis not present

## 2016-04-03 DIAGNOSIS — F419 Anxiety disorder, unspecified: Secondary | ICD-10-CM | POA: Diagnosis not present

## 2016-04-03 DIAGNOSIS — Z79899 Other long term (current) drug therapy: Secondary | ICD-10-CM | POA: Diagnosis not present

## 2016-04-03 DIAGNOSIS — K8689 Other specified diseases of pancreas: Secondary | ICD-10-CM

## 2016-04-03 DIAGNOSIS — K839 Disease of biliary tract, unspecified: Secondary | ICD-10-CM | POA: Diagnosis not present

## 2016-04-03 DIAGNOSIS — R7303 Prediabetes: Secondary | ICD-10-CM | POA: Diagnosis not present

## 2016-04-03 DIAGNOSIS — Z8619 Personal history of other infectious and parasitic diseases: Secondary | ICD-10-CM | POA: Diagnosis not present

## 2016-04-03 DIAGNOSIS — F329 Major depressive disorder, single episode, unspecified: Secondary | ICD-10-CM | POA: Diagnosis not present

## 2016-04-03 HISTORY — DX: Anxiety disorder, unspecified: F41.9

## 2016-04-03 HISTORY — PX: EUS: SHX5427

## 2016-04-03 HISTORY — DX: Major depressive disorder, single episode, unspecified: F32.9

## 2016-04-03 HISTORY — DX: Inflammatory liver disease, unspecified: K75.9

## 2016-04-03 HISTORY — DX: Depression, unspecified: F32.A

## 2016-04-03 HISTORY — DX: Prediabetes: R73.03

## 2016-04-03 SURGERY — UPPER ENDOSCOPIC ULTRASOUND (EUS) RADIAL
Anesthesia: Monitor Anesthesia Care

## 2016-04-03 MED ORDER — SODIUM CHLORIDE 0.9 % IV SOLN: INTRAVENOUS | Status: DC

## 2016-04-03 MED ORDER — PROPOFOL 10 MG/ML IV BOLUS
INTRAVENOUS | Status: AC
  Filled 2016-04-03: qty 40

## 2016-04-03 MED ORDER — LACTATED RINGERS IV SOLN
INTRAVENOUS | Status: DC
  Administered 2016-04-03: 09:00:00 via INTRAVENOUS

## 2016-04-03 MED ORDER — LACTATED RINGERS IV SOLN
INTRAVENOUS | Status: DC | PRN
  Administered 2016-04-03: 09:00:00 via INTRAVENOUS

## 2016-04-03 MED ORDER — PROPOFOL 10 MG/ML IV BOLUS
INTRAVENOUS | Status: DC | PRN
  Administered 2016-04-03: 10 mg via INTRAVENOUS
  Administered 2016-04-03 (×3): 20 mg via INTRAVENOUS
  Administered 2016-04-03 (×2): 40 mg via INTRAVENOUS
  Administered 2016-04-03: 20 mg via INTRAVENOUS
  Administered 2016-04-03: 10 mg via INTRAVENOUS

## 2016-04-03 NOTE — Anesthesia Preprocedure Evaluation (Addendum)
Anesthesia Evaluation  Patient identified by MRN, date of birth, ID band Patient awake  General Assessment Comment:Past Medical History: Diagnosis Date . Anxiety  . Borderline diabetes  . Chest pain  . Depression  . Gonorrhea   remote h/o gonorrhea which was treated as per patient . Headache(784.0)  . Hepatitis   Hepatitis C- took Harvani medication . Hyperlipidemia  . Hypertension     Reviewed: Allergy & Precautions, NPO status , Patient's Chart, lab work & pertinent test results  Airway Mallampati: II  TM Distance: >3 FB Neck ROM: Full    Dental no notable dental hx.    Pulmonary former smoker,    Pulmonary exam normal breath sounds clear to auscultation       Cardiovascular hypertension, Normal cardiovascular exam Rhythm:Regular Rate:Normal     Neuro/Psych  Headaches, negative psych ROS   GI/Hepatic negative GI ROS, (+) Hepatitis -, C  Endo/Other  diabetesBorderline diabetes.  Renal/GU negative Renal ROS  negative genitourinary   Musculoskeletal negative musculoskeletal ROS (+)   Abdominal   Peds negative pediatric ROS (+)  Hematology negative hematology ROS (+)   Anesthesia Other Findings   Reproductive/Obstetrics negative OB ROS                            Anesthesia Physical Anesthesia Plan  ASA: II  Anesthesia Plan: MAC   Post-op Pain Management:    Induction: Intravenous  Airway Management Planned: Natural Airway  Additional Equipment:   Intra-op Plan:   Post-operative Plan:   Informed Consent: I have reviewed the patients History and Physical, chart, labs and discussed the procedure including the risks, benefits and alternatives for the proposed anesthesia with the patient or authorized representative who has indicated his/her understanding and acceptance.   Dental advisory given  Plan Discussed with: CRNA  Anesthesia Plan Comments:          Anesthesia Quick Evaluation

## 2016-04-03 NOTE — Op Note (Signed)
[facility] Patient Name: Sean Shannon Procedure Date: 04/03/2016 MRN: [id number] Attending MD: Milus Banister , MD Date of Birth: 10/04/1955 CSN: [id number] Age: 60 Admit Type: Outpatient Procedure:                Upper EUS Indications:              incidentally noted dilated CBD and PD on recent MRI                            done at [facility] LFTs normal, no personal or family                            history of pancreatic disease; no weight loss Providers:                Milus Banister, MD, Zenon Mayo, RN, William Dalton, Technician Referring MD:              Medicines:                Monitored Anesthesia Care Complications:            No immediate complications. Estimated blood loss:                            None. Estimated Blood Loss:     Estimated blood loss: none. Procedure:                Pre-Anesthesia Assessment:                           - Prior to the procedure, a History and Physical                            was performed, and patient medications and                            allergies were reviewed. The patient's tolerance of                            previous anesthesia was also reviewed. The risks                            and benefits of the procedure and the sedation                            options and risks were discussed with the patient.                            All questions were answered, and informed consent                            was obtained. Prior Anticoagulants: The patient has  taken no previous anticoagulant or antiplatelet                            agents. ASA Grade Assessment: II - A patient with                            mild systemic disease. After reviewing the risks                            and benefits, the patient was deemed in                            satisfactory condition to undergo the procedure.                           After obtaining  informed consent, the endoscope was                            passed under direct vision. Throughout the                            procedure, the patient's blood pressure, pulse, and                            oxygen saturations were monitored continuously. The                            [id number]) scope was introduced through                            the mouth, and advanced to the second part of                            duodenum. The [id number]) scope was                            introduced through the mouth, and advanced to the                            second part of duodenum. The upper EUS was                            accomplished without difficulty. The patient                            tolerated the procedure well. Findings:      Endoscopic Finding :      The examined UGI tract was normal including good duodenoscope evaluation       of a normal major papilla.      Endosonographic Finding :      1. This was a normal examination; visualized portions of the left lobe       of the liver, gallbladder, biliary system and pancreas were normal on       ultrasound examination.  2. CBD measured 5.3mm maximally.      3. Main pancreatic duct measured 30mm in the head and tapered smoothly       into the tail of pancreas      4. Pancreatic parenchyma was normal, no discrete masses or signs of       chronic pancreatitis. Impression:               - Normal endoscopic and endosonographic evaluation                            of the UGI tract. Moderate Sedation:      N/A- Per Anesthesia Care Recommendation:           - Discharge patient to home (ambulatory).                           - No need for further evaluation. Procedure Code(s):        --- Professional ---                           [phone], Esophagogastroduodenoscopy, flexible,                            transoral; with endoscopic ultrasound examination                            limited to the esophagus,  stomach or duodenum, and                            adjacent structures Diagnosis Code(s):        --- Professional ---                           K83.8, Other specified diseases of biliary tract CPT copyright 2016 American Medical Association. All rights reserved. The codes documented in this report are preliminary and upon coder review may  be revised to meet current compliance requirements. Milus Banister, MD 04/03/2016 10:00:47 AM This report has been signed electronically. Number of Addenda: 0

## 2016-04-03 NOTE — H&P (Signed)
  HPI: This is a 60 yo man  Chief complaint is bile duct, panc duct dilation  [facility] Patient referred for EUS evaluation for incidentally noted CBD dilation (65mm per MRI) and PD dilation (85mm per MRI) on outside films.  LFTs have been normal.  NO personal history of biliary or pancreatic disease.  No history of etoh abuse.    Weight has been stable.  ROS: complete GI ROS as described in HPI.  Constitutional:  No unintentional weight loss   Past Medical History:  Diagnosis Date  . Anxiety   . Borderline diabetes   . Chest pain   . Depression   . Gonorrhea    remote h/o gonorrhea which was treated as per patient  . Headache(784.0)   . Hepatitis    Hepatitis C- took Harvani medication  . Hyperlipidemia   . Hypertension     Past Surgical History:  Procedure Laterality Date  . SPINAL FIXATION SURGERY    . TONSILECTOMY, ADENOIDECTOMY, BILATERAL MYRINGOTOMY AND TUBES  1959  . TONSILLECTOMY      No current facility-administered medications for this encounter.     Allergies as of 03/27/2016  . (No Known Allergies)    Family History  Problem Relation Age of Onset  . Diabetes      Family history    Social History   Social History  . Marital status: Single    Spouse name: N/A  . Number of children: N/A  . Years of education: N/A   Occupational History  . Nature conservation officer    Social History Main Topics  . Smoking status: Former Smoker    Types: Cigarettes    Quit date: 06/09/1993  . Smokeless tobacco: Never Used  . Alcohol use No  . 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 Sexual activity: Yes    Birth control/ protection: Condom   Other Topics Concern  . Not on file   Social History Narrative   Single, no children.   [employer], lives with his fiance in a house     Physical Exam: There were no vitals taken for this visit. Constitutional: generally  well-appearing Psychiatric: alert and oriented x3 Abdomen: soft, nontender, nondistended, no obvious ascites, no peritoneal signs, normal bowel sounds No peripheral edema noted in lower extremities  Assessment and plan: 60 y.o. male with incidentally noted mild CBD and PD dilation with normal LFTs  For upper EUS today, also side view evaluation of ampulla   Owens Loffler, MD [facility] 04/03/2016, 8:59 AM

## 2016-04-03 NOTE — Anesthesia Postprocedure Evaluation (Signed)
Anesthesia Post Note  Patient: Sean Shannon  Procedure(s) Performed: Procedure(s) (LRB): UPPER ENDOSCOPIC ULTRASOUND (EUS) RADIAL (N/A)  Patient location during evaluation: PACU Anesthesia Type: MAC Level of consciousness: awake and alert Pain management: pain level controlled Vital Signs Assessment: post-procedure vital signs reviewed and stable Respiratory status: spontaneous breathing, nonlabored ventilation, respiratory function stable and patient connected to nasal cannula oxygen Cardiovascular status: stable and blood pressure returned to baseline Anesthetic complications: no    Last Vitals:  Vitals:   04/03/16 1020 04/03/16 1030  BP: (!) 193/100   Pulse:    Resp:  11  Temp:      Last Pain:  Vitals:   04/03/16 0951  TempSrc: Oral                 Gianmarco Roye J

## 2016-04-03 NOTE — Transfer of Care (Signed)
Immediate Anesthesia Transfer of Care Note  Patient: Sean Shannon  Procedure(s) Performed: Procedure(s): UPPER ENDOSCOPIC ULTRASOUND (EUS) RADIAL (N/A)  Patient Location: PACU and Endoscopy Unit  Anesthesia Type:MAC  Level of Consciousness: awake, oriented, patient cooperative, lethargic and responds to stimulation  Airway & Oxygen Therapy: Patient Spontanous Breathing and Patient connected to nasal cannula oxygen  Post-op Assessment: Report given to RN, Post -op Vital signs reviewed and stable and Patient moving all extremities  Post vital signs: Reviewed and stable  Last Vitals:  Vitals:   04/03/16 0904  BP: (!) 173/91  Pulse: 66  Resp: 12  Temp: 36.8 C    Last Pain:  Vitals:   04/03/16 0904  TempSrc: Oral         Complications: No apparent anesthesia complications

## 2016-04-03 NOTE — Discharge Instructions (Signed)
YOU HAD AN ENDOSCOPIC PROCEDURE TODAY: Refer to the procedure report and other information in the discharge instructions given to you for any specific questions about what was found during the examination. If this information does not answer your questions, please call [place] office at [phone] to clarify.  ° °YOU SHOULD EXPECT: Some feelings of bloating in the abdomen. Passage of more gas than usual. Walking can help get rid of the air that was put into your GI tract during the procedure and reduce the bloating. If you had a lower endoscopy (such as a colonoscopy or flexible sigmoidoscopy) you may notice spotting of blood in your stool or on the toilet paper. Some abdominal soreness may be present for a day or two, also. ° °DIET: Your first meal following the procedure should be a light meal and then it is ok to progress to your normal diet. A half-sandwich or bowl of soup is an example of a good first meal. Heavy or fried foods are harder to digest and may make you feel nauseous or bloated. Drink plenty of fluids but you should avoid alcoholic beverages for 24 hours. If you had a esophageal dilation, please see attached instructions for diet.   ° °ACTIVITY: Your care partner should take you home directly after the procedure. You should plan to take it easy, moving slowly for the rest of the day. You can resume normal activity the day after the procedure however YOU SHOULD NOT DRIVE, use power tools, machinery or perform tasks that involve climbing or major physical exertion for 24 hours (because of the sedation medicines used during the test).  ° °SYMPTOMS TO REPORT IMMEDIATELY: °A gastroenterologist can be reached at any hour. Please call [phone]  for any of the following symptoms:  °Following lower endoscopy (colonoscopy, flexible sigmoidoscopy) °Excessive amounts of blood in the stool  °Significant tenderness, worsening of abdominal pains  °Swelling of the abdomen that is new, acute  °Fever of 100° or  higher  °Following upper endoscopy (EGD, EUS, ERCP, esophageal dilation) °Vomiting of blood or coffee ground material  °New, significant abdominal pain  °New, significant chest pain or pain under the shoulder blades  °Painful or persistently difficult swallowing  °New shortness of breath  °Black, tarry-looking or red, bloody stools ° °FOLLOW UP:  °If any biopsies were taken you will be contacted by phone or by letter within the next 1-3 weeks. Call [phone]  if you have not heard about the biopsies in 3 weeks.  °Please also call with any specific questions about appointments or follow up tests. ° °

## 2016-04-04 ENCOUNTER — Encounter (HOSPITAL_COMMUNITY): Payer: Self-pay | Admitting: Gastroenterology

## 2016-12-29 ENCOUNTER — Emergency Department (HOSPITAL_COMMUNITY)
Admission: EM | Admit: 2016-12-29 | Discharge: 2016-12-29 | Discharge status: Against Medical Advice | Payer: Non-veteran care

## 2016-12-29 NOTE — ED Notes (Signed)
Pt stated that he did not want to be seen

## 2017-05-09 ENCOUNTER — Emergency Department (HOSPITAL_COMMUNITY): Payer: Non-veteran care

## 2017-05-09 ENCOUNTER — Encounter (HOSPITAL_COMMUNITY): Payer: Self-pay | Admitting: Emergency Medicine

## 2017-05-09 ENCOUNTER — Inpatient Hospital Stay (HOSPITAL_COMMUNITY)
Admission: EM | Admit: 2017-05-09 | Discharge: 2017-05-13 | DRG: 917 | Disposition: A | Discharge status: Home / Self Care | Payer: Non-veteran care | Attending: Family Medicine | Admitting: Family Medicine

## 2017-05-09 DIAGNOSIS — R4182 Altered mental status, unspecified: Secondary | ICD-10-CM | POA: Diagnosis present

## 2017-05-09 DIAGNOSIS — F419 Anxiety disorder, unspecified: Secondary | ICD-10-CM | POA: Diagnosis present

## 2017-05-09 DIAGNOSIS — D320 Benign neoplasm of cerebral meninges: Secondary | ICD-10-CM | POA: Diagnosis present

## 2017-05-09 DIAGNOSIS — T407X1A Poisoning by cannabis (derivatives), accidental (unintentional), initial encounter: Secondary | ICD-10-CM | POA: Diagnosis present

## 2017-05-09 DIAGNOSIS — G9340 Encephalopathy, unspecified: Secondary | ICD-10-CM

## 2017-05-09 DIAGNOSIS — Z791 Long term (current) use of non-steroidal anti-inflammatories (NSAID): Secondary | ICD-10-CM

## 2017-05-09 DIAGNOSIS — T401X1A Poisoning by heroin, accidental (unintentional), initial encounter: Secondary | ICD-10-CM | POA: Diagnosis present

## 2017-05-09 DIAGNOSIS — F1210 Cannabis abuse, uncomplicated: Secondary | ICD-10-CM | POA: Diagnosis present

## 2017-05-09 DIAGNOSIS — T405X1A Poisoning by cocaine, accidental (unintentional), initial encounter: Principal | ICD-10-CM | POA: Diagnosis present

## 2017-05-09 DIAGNOSIS — G92 Toxic encephalopathy: Secondary | ICD-10-CM | POA: Diagnosis present

## 2017-05-09 DIAGNOSIS — R34 Anuria and oliguria: Secondary | ICD-10-CM | POA: Diagnosis not present

## 2017-05-09 DIAGNOSIS — Y92410 Unspecified street and highway as the place of occurrence of the external cause: Secondary | ICD-10-CM

## 2017-05-09 DIAGNOSIS — F1410 Cocaine abuse, uncomplicated: Secondary | ICD-10-CM | POA: Diagnosis present

## 2017-05-09 DIAGNOSIS — E43 Unspecified severe protein-calorie malnutrition: Secondary | ICD-10-CM | POA: Diagnosis present

## 2017-05-09 DIAGNOSIS — F329 Major depressive disorder, single episode, unspecified: Secondary | ICD-10-CM | POA: Diagnosis present

## 2017-05-09 DIAGNOSIS — R7303 Prediabetes: Secondary | ICD-10-CM | POA: Diagnosis present

## 2017-05-09 DIAGNOSIS — Z01818 Encounter for other preprocedural examination: Secondary | ICD-10-CM

## 2017-05-09 DIAGNOSIS — Z6820 Body mass index (BMI) 20.0-20.9, adult: Secondary | ICD-10-CM

## 2017-05-09 DIAGNOSIS — T40601A Poisoning by unspecified narcotics, accidental (unintentional), initial encounter: Secondary | ICD-10-CM

## 2017-05-09 DIAGNOSIS — I10 Essential (primary) hypertension: Secondary | ICD-10-CM | POA: Diagnosis present

## 2017-05-09 DIAGNOSIS — Z7982 Long term (current) use of aspirin: Secondary | ICD-10-CM

## 2017-05-09 DIAGNOSIS — J9690 Respiratory failure, unspecified, unspecified whether with hypoxia or hypercapnia: Secondary | ICD-10-CM

## 2017-05-09 DIAGNOSIS — Z87891 Personal history of nicotine dependence: Secondary | ICD-10-CM

## 2017-05-09 DIAGNOSIS — J9601 Acute respiratory failure with hypoxia: Secondary | ICD-10-CM

## 2017-05-09 LAB — COMPREHENSIVE METABOLIC PANEL
ALT: 17 U/L (ref 17–63)
ANION GAP: 7 (ref 5–15)
AST: 25 U/L (ref 15–41)
Albumin: 3.7 g/dL (ref 3.5–5.0)
Alkaline Phosphatase: 64 U/L (ref 38–126)
BUN: 10 mg/dL (ref 6–20)
CHLORIDE: 98 mmol/L — AB (ref 101–111)
CO2: 29 mmol/L (ref 22–32)
CREATININE: 0.92 mg/dL (ref 0.61–1.24)
Calcium: 8.4 mg/dL — ABNORMAL LOW (ref 8.9–10.3)
Glucose, Bld: 103 mg/dL — ABNORMAL HIGH (ref 65–99)
POTASSIUM: 4.3 mmol/L (ref 3.5–5.1)
SODIUM: 134 mmol/L — AB (ref 135–145)
Total Bilirubin: 0.9 mg/dL (ref 0.3–1.2)
Total Protein: 6.2 g/dL — ABNORMAL LOW (ref 6.5–8.1)

## 2017-05-09 LAB — CBC WITH DIFFERENTIAL/PLATELET
Basophils Absolute: 0 10*3/uL (ref 0.0–0.1)
Basophils Relative: 1 %
EOS ABS: 0.1 10*3/uL (ref 0.0–0.7)
EOS PCT: 2 %
HCT: 37.2 % — ABNORMAL LOW (ref 39.0–52.0)
Hemoglobin: 12.3 g/dL — ABNORMAL LOW (ref 13.0–17.0)
LYMPHS ABS: 1.6 10*3/uL (ref 0.7–4.0)
LYMPHS PCT: 28 %
MCH: 29.2 pg (ref 26.0–34.0)
MCHC: 33.1 g/dL (ref 30.0–36.0)
MCV: 88.4 fL (ref 78.0–100.0)
MONO ABS: 0.7 10*3/uL (ref 0.1–1.0)
Monocytes Relative: 11 %
Neutro Abs: 3.4 10*3/uL (ref 1.7–7.7)
Neutrophils Relative %: 58 %
PLATELETS: 215 10*3/uL (ref 150–400)
RBC: 4.21 MIL/uL — ABNORMAL LOW (ref 4.22–5.81)
RDW: 13 % (ref 11.5–15.5)
WBC: 5.8 10*3/uL (ref 4.0–10.5)

## 2017-05-09 LAB — PROTIME-INR
INR: 1.02
PROTHROMBIN TIME: 13.3 s (ref 11.4–15.2)

## 2017-05-09 LAB — I-STAT TROPONIN, ED: Troponin i, poc: 0.02 ng/mL (ref 0.00–0.08)

## 2017-05-09 MED ORDER — IOPAMIDOL (ISOVUE-370) INJECTION 76%
INTRAVENOUS | Status: AC
  Administered 2017-05-10: 100 mL
  Filled 2017-05-09: qty 100

## 2017-05-09 NOTE — ED Notes (Signed)
ED Provider at bedside. 

## 2017-05-09 NOTE — ED Provider Notes (Signed)
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 Narcan. Became near apneic again and narcan gtt started. ABG without significant CO2 retention.  Narcan drip is being started and patient will be need admission ICU per Dr. Alcario Drought.   Patient responds to noxious stimuli and is protecting airway. WIll hold intubation at this time. D/w PCCM Dr. Oletta Darter and Dr. Milon Dikes.   CRITICAL CARE Performed by: Ezequiel Essex Total critical care time: 90 minutes Critical care time was exclusive of separately billable procedures and treating other patients. Critical care was necessary to treat or prevent imminent or life-threatening deterioration. Critical care was time spent personally by me on the following activities: development of treatment plan with patient and/or surrogate as well as nursing, discussions with consultants, evaluation of patient's response to treatment, examination of patient, obtaining history from patient or surrogate, ordering and performing treatments and  interventions, ordering and review of laboratory studies, ordering and review of radiographic studies, pulse oximetry and re-evaluation of patient's condition.  Final Clinical Impressions(s) / ED Diagnoses   Final diagnoses:  Acute encephalopathy  Acute respiratory failure with hypoxia ([place])  Opiate overdose, accidental or unintentional, initial encounter [facility])    ED Discharge Orders    None       Ezequiel Essex, MD 05/10/17 [phone]

## 2017-05-09 NOTE — ED Triage Notes (Signed)
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 Pt choosing not to communicate  with RN in triage, however would talk to EMS in the truck and with [employer]. EMS states that he denied substance use today to [employer], hx heroin overdose earlier this year.

## 2017-05-10 ENCOUNTER — Inpatient Hospital Stay (HOSPITAL_COMMUNITY): Payer: Non-veteran care

## 2017-05-10 ENCOUNTER — Emergency Department (HOSPITAL_COMMUNITY): Payer: Non-veteran care

## 2017-05-10 ENCOUNTER — Encounter (HOSPITAL_COMMUNITY): Payer: Self-pay | Admitting: Oncology

## 2017-05-10 DIAGNOSIS — R4182 Altered mental status, unspecified: Secondary | ICD-10-CM | POA: Diagnosis present

## 2017-05-10 DIAGNOSIS — T40601A Poisoning by unspecified narcotics, accidental (unintentional), initial encounter: Secondary | ICD-10-CM

## 2017-05-10 DIAGNOSIS — G9340 Encephalopathy, unspecified: Secondary | ICD-10-CM | POA: Diagnosis not present

## 2017-05-10 DIAGNOSIS — R400 Somnolence: Secondary | ICD-10-CM

## 2017-05-10 DIAGNOSIS — J9601 Acute respiratory failure with hypoxia: Secondary | ICD-10-CM | POA: Diagnosis not present

## 2017-05-10 LAB — POCT I-STAT 3, ART BLOOD GAS (G3+)
ACID-BASE EXCESS: 5 mmol/L — AB (ref 0.0–2.0)
Bicarbonate: 29.9 mmol/L — ABNORMAL HIGH (ref 20.0–28.0)
O2 SAT: 99 %
PCO2 ART: 43.8 mmHg (ref 32.0–48.0)
PO2 ART: 120 mmHg — AB (ref 83.0–108.0)
Patient temperature: 36.5
TCO2: 31 mmol/L (ref 22–32)
pH, Arterial: 7.44 (ref 7.350–7.450)

## 2017-05-10 LAB — I-STAT ARTERIAL BLOOD GAS, ED
Acid-Base Excess: 5 mmol/L — ABNORMAL HIGH (ref 0.0–2.0)
Bicarbonate: 32.4 mmol/L — ABNORMAL HIGH (ref 20.0–28.0)
O2 Saturation: 98 %
PCO2 ART: 57.5 mmHg — AB (ref 32.0–48.0)
PH ART: 7.359 (ref 7.350–7.450)
PO2 ART: 118 mmHg — AB (ref 83.0–108.0)
Patient temperature: 98.7
TCO2: 34 mmol/L — ABNORMAL HIGH (ref 22–32)

## 2017-05-10 LAB — CBC
HEMATOCRIT: 44.5 % (ref 39.0–52.0)
Hemoglobin: 14.7 g/dL (ref 13.0–17.0)
MCH: 29.2 pg (ref 26.0–34.0)
MCHC: 33 g/dL (ref 30.0–36.0)
MCV: 88.5 fL (ref 78.0–100.0)
Platelets: 227 10*3/uL (ref 150–400)
RBC: 5.03 MIL/uL (ref 4.22–5.81)
RDW: 13.1 % (ref 11.5–15.5)
WBC: 8.5 10*3/uL (ref 4.0–10.5)

## 2017-05-10 LAB — CK: Total CK: 266 U/L (ref 49–397)

## 2017-05-10 LAB — TROPONIN I
Troponin I: <0.03 ng/mL (ref <0.03)
Troponin I: <0.03 ng/mL (ref <0.03)

## 2017-05-10 LAB — CREATININE, SERUM
Creatinine, Ser: 0.83 mg/dL (ref 0.61–1.24)
GFR calc Af Amer: >60 mL/min (ref >60)

## 2017-05-10 LAB — RAPID URINE DRUG SCREEN, HOSP PERFORMED
AMPHETAMINES: NOT DETECTED
BARBITURATES: NOT DETECTED
Benzodiazepines: NOT DETECTED
Cocaine: POSITIVE — AB
Opiates: POSITIVE — AB
TETRAHYDROCANNABINOL: POSITIVE — AB

## 2017-05-10 LAB — HIV ANTIBODY (ROUTINE TESTING W REFLEX): HIV SCREEN 4TH GENERATION: NONREACTIVE

## 2017-05-10 LAB — CK TOTAL AND CKMB (NOT AT ARMC)
CK TOTAL: 193 U/L (ref 49–397)
CK TOTAL: 155 U/L (ref 49–397)
CK, MB: 5.6 ng/mL — AB (ref 0.5–5.0)
CK, MB: 4 ng/mL (ref 0.5–5.0)
CK, MB: 4.2 ng/mL (ref 0.5–5.0)
Relative Index: 2.9 — ABNORMAL HIGH (ref 0.0–2.5)
Relative Index: 2.6 — ABNORMAL HIGH (ref 0.0–2.5)
Relative Index: 3.2 — ABNORMAL HIGH (ref 0.0–2.5)
Total CK: 133 U/L (ref 49–397)

## 2017-05-10 LAB — ACETAMINOPHEN LEVEL: Acetaminophen (Tylenol), Serum: <10 ug/mL — ABNORMAL LOW (ref 10–30)

## 2017-05-10 LAB — ETHANOL: Alcohol, Ethyl (B): <10 mg/dL (ref <10)

## 2017-05-10 LAB — SALICYLATE LEVEL: Salicylate Lvl: <7 mg/dL (ref 2.8–30.0)

## 2017-05-10 LAB — AMMONIA: Ammonia: 38 umol/L — ABNORMAL HIGH (ref 9–35)

## 2017-05-10 LAB — MRSA PCR SCREENING: MRSA BY PCR: NEGATIVE

## 2017-05-10 LAB — TSH: TSH: 0.157 u[IU]/mL — ABNORMAL LOW (ref 0.350–4.500)

## 2017-05-10 MED ORDER — ASPIRIN 300 MG RE SUPP: 300.0000 mg | RECTAL | Status: AC

## 2017-05-10 MED ORDER — MIDAZOLAM HCL 2 MG/2ML IJ SOLN
2.0000 mg | Freq: Once | INTRAMUSCULAR | Status: DC
  Filled 2017-05-10: qty 2

## 2017-05-10 MED ORDER — HALOPERIDOL LACTATE 5 MG/ML IJ SOLN: 5.0000 mg | Freq: Four times a day (QID) | INTRAMUSCULAR | Status: DC | PRN

## 2017-05-10 MED ORDER — ORAL CARE MOUTH RINSE
15.0000 mL | Freq: Four times a day (QID) | OROMUCOSAL | Status: DC
  Administered 2017-05-10 – 2017-05-11 (×4): 15 mL via OROMUCOSAL

## 2017-05-10 MED ORDER — NALOXONE HCL 0.4 MG/ML IJ SOLN
1.5000 mg/h | INTRAVENOUS | Status: DC
  Administered 2017-05-10 (×2): 1.5 mg/h via INTRAVENOUS
  Filled 2017-05-10 (×3): qty 10

## 2017-05-10 MED ORDER — NALOXONE HCL 0.4 MG/ML IJ SOLN
INTRAMUSCULAR | Status: AC
  Filled 2017-05-10: qty 1

## 2017-05-10 MED ORDER — ROCURONIUM BROMIDE 50 MG/5ML IV SOLN
50.0000 mg | Freq: Once | INTRAVENOUS | Status: AC
  Administered 2017-05-10: 50 mg via INTRAVENOUS

## 2017-05-10 MED ORDER — FENTANYL CITRATE (PF) 100 MCG/2ML IJ SOLN
100.0000 ug | Freq: Once | INTRAMUSCULAR | Status: DC
  Filled 2017-05-10: qty 2

## 2017-05-10 MED ORDER — LORAZEPAM 2 MG/ML IJ SOLN: 2.0000 mg | INTRAMUSCULAR | Status: DC | PRN

## 2017-05-10 MED ORDER — ASPIRIN 81 MG PO CHEW
324.0000 mg | CHEWABLE_TABLET | ORAL | Status: AC
Start: 2017-05-10 — End: 2017-05-11

## 2017-05-10 MED ORDER — CHLORHEXIDINE GLUCONATE 0.12% ORAL RINSE (MEDLINE KIT)
15.0000 mL | Freq: Two times a day (BID) | OROMUCOSAL | Status: DC
  Administered 2017-05-10 – 2017-05-11 (×3): 15 mL via OROMUCOSAL

## 2017-05-10 MED ORDER — ETOMIDATE 2 MG/ML IV SOLN
20.0000 mg | Freq: Once | INTRAVENOUS | Status: AC
  Administered 2017-05-10: 20 mg via INTRAVENOUS

## 2017-05-10 MED ORDER — DOCUSATE SODIUM 50 MG/5ML PO LIQD: 100.0000 mg | Freq: Two times a day (BID) | ORAL | Status: DC | PRN

## 2017-05-10 MED ORDER — FENTANYL CITRATE (PF) 100 MCG/2ML IJ SOLN
INTRAMUSCULAR | Status: AC
  Administered 2017-05-10: 14:00:00
  Filled 2017-05-10: qty 2

## 2017-05-10 MED ORDER — SODIUM CHLORIDE 0.9 % IV SOLN: 250.0000 mL | INTRAVENOUS | Status: DC | PRN

## 2017-05-10 MED ORDER — FENTANYL CITRATE (PF) 100 MCG/2ML IJ SOLN
100.0000 ug | INTRAMUSCULAR | Status: DC | PRN
  Administered 2017-05-10 – 2017-05-11 (×3): 100 ug via INTRAVENOUS
  Filled 2017-05-10 (×2): qty 2

## 2017-05-10 MED ORDER — CLONIDINE HCL 0.3 MG/24HR TD PTWK
0.3000 mg | MEDICATED_PATCH | TRANSDERMAL | Status: DC
  Administered 2017-05-10: 0.3 mg via TRANSDERMAL
  Filled 2017-05-10: qty 1

## 2017-05-10 MED ORDER — NALOXONE HCL 0.4 MG/ML IJ SOLN
INTRAMUSCULAR | Status: AC
  Administered 2017-05-10: 03:00:00
  Filled 2017-05-10: qty 1

## 2017-05-10 MED ORDER — HYDRALAZINE HCL 20 MG/ML IJ SOLN
10.0000 mg | INTRAMUSCULAR | Status: DC | PRN
  Administered 2017-05-10: 10 mg via INTRAVENOUS
  Filled 2017-05-10: qty 1

## 2017-05-10 MED ORDER — MIDAZOLAM HCL 2 MG/2ML IJ SOLN
INTRAMUSCULAR | Status: AC
  Administered 2017-05-10: 14:00:00
  Filled 2017-05-10: qty 2

## 2017-05-10 MED ORDER — NALOXONE HCL 0.4 MG/ML IJ SOLN
0.4000 mg | INTRAMUSCULAR | Status: DC | PRN
Start: 2017-05-10 — End: 2017-05-13
  Administered 2017-05-10 (×2): 0.4 mg via INTRAVENOUS

## 2017-05-10 MED ORDER — NALOXONE HCL 0.4 MG/ML IJ SOLN
0.4000 mg | Freq: Once | INTRAMUSCULAR | Status: AC
  Administered 2017-05-10: 0.4 mg via INTRAVENOUS

## 2017-05-10 MED ORDER — ENOXAPARIN SODIUM 40 MG/0.4ML ~~LOC~~ SOLN
40.0000 mg | SUBCUTANEOUS | Status: DC
  Administered 2017-05-10 – 2017-05-12 (×3): 40 mg via SUBCUTANEOUS
  Filled 2017-05-10 (×3): qty 0.4

## 2017-05-10 MED ORDER — FENTANYL CITRATE (PF) 100 MCG/2ML IJ SOLN
100.0000 ug | INTRAMUSCULAR | Status: DC | PRN
  Administered 2017-05-10: 100 ug via INTRAVENOUS
  Filled 2017-05-10: qty 2

## 2017-05-10 MED ORDER — MIDAZOLAM HCL 2 MG/2ML IJ SOLN
2.0000 mg | INTRAMUSCULAR | Status: DC | PRN
  Administered 2017-05-10 – 2017-05-11 (×6): 2 mg via INTRAVENOUS
  Filled 2017-05-10 (×5): qty 2

## 2017-05-10 MED ORDER — SODIUM CHLORIDE 0.9 % IV BOLUS (SEPSIS)
1000.0000 mL | Freq: Once | INTRAVENOUS | Status: AC
  Administered 2017-05-10: 1000 mL via INTRAVENOUS

## 2017-05-10 MED ORDER — DEXTROSE 5 % IV SOLN
0.5000 mg/h | INTRAVENOUS | Status: DC
  Administered 2017-05-10: 0.5 mg/h via INTRAVENOUS
  Filled 2017-05-10: qty 10

## 2017-05-10 MED ORDER — NALOXONE HCL 2 MG/2ML IJ SOSY
PREFILLED_SYRINGE | INTRAMUSCULAR | Status: AC
  Administered 2017-05-10: 1 mg
  Filled 2017-05-10: qty 2

## 2017-05-10 MED ORDER — AMMONIA AROMATIC IN INHA
RESPIRATORY_TRACT | Status: AC
  Filled 2017-05-10: qty 10

## 2017-05-10 MED ORDER — LABETALOL HCL 5 MG/ML IV SOLN
10.0000 mg | INTRAVENOUS | Status: DC | PRN
  Administered 2017-05-12: 10 mg via INTRAVENOUS
  Filled 2017-05-10 (×2): qty 4

## 2017-05-10 MED ORDER — MIDAZOLAM HCL 2 MG/2ML IJ SOLN
2.0000 mg | INTRAMUSCULAR | Status: AC | PRN
  Administered 2017-05-10 – 2017-05-11 (×3): 2 mg via INTRAVENOUS
  Filled 2017-05-10 (×3): qty 2

## 2017-05-10 MED ORDER — NALOXONE HCL 0.4 MG/ML IJ SOLN
INTRAMUSCULAR | Status: AC
  Administered 2017-05-10: 05:00:00
  Filled 2017-05-10: qty 1

## 2017-05-10 MED ORDER — PANTOPRAZOLE SODIUM 40 MG IV SOLR
40.0000 mg | Freq: Every day | INTRAVENOUS | Status: DC
  Administered 2017-05-10 – 2017-05-12 (×3): 40 mg via INTRAVENOUS
  Filled 2017-05-10 (×3): qty 40

## 2017-05-10 MED ORDER — SODIUM CHLORIDE 0.9 % IV SOLN
INTRAVENOUS | Status: DC
  Administered 2017-05-10 (×2): via INTRAVENOUS
  Administered 2017-05-11: 1000 mL via INTRAVENOUS
  Administered 2017-05-12: via INTRAVENOUS

## 2017-05-10 NOTE — ED Notes (Signed)
Ccm consulting Provider at bedside.

## 2017-05-10 NOTE — Progress Notes (Signed)
While getting eeg, pt raised up in bed and began projectile vomiting. Pt then became even less responsive.  Notified Elink/Dr. Halford Chessman.  [facility] sends Dr. Vaughan Browner and Rexene Edison, NP easy intubation done at bedside.

## 2017-05-10 NOTE — Progress Notes (Signed)
Same day progress note  Patient seen and examined. Continues to be on Narcan drip On examination, he is drowsy, does not really open eyes to voice but follows Menz in all 4 extremities. He is pupils are equal round and reactive. All 4 extremities are antigravity at least with good strength with no focal weakness.  His ammonia level is 38 mildly elevated, TSH is 0.157  Assessment and plan 61 year old male with encephalopathy in the setting of polysubstance abuse.  He is on a Narcan drip and continues to be encephalopathic although a little better than in his initial exam.  Impression Toxic metabolic encephalopathy  Recommendation I would agree with getting an EEG, MRI brain. Monitor clinically. Neurology will follow with you.   Amie Portland, MD [facility] [phone] If 7pm to 7am, please call on call as listed on AMION.

## 2017-05-10 NOTE — Progress Notes (Deleted)
Notified Dr. Halford Chessman of pt's rhythm changes as well.

## 2017-05-10 NOTE — Procedures (Signed)
Intubation Procedure Note Sean Shannon [id number] 1955-09-05  Procedure: Intubation Indications: Airway protection and maintenance  Procedure Details Consent: Unable to obtain consent because of emergent medical necessity. Time Out: Verified patient identification, verified procedure, site/side was marked, verified correct patient position, special equipment/implants available, medications/allergies/relevent history reviewed, required imaging and test results available.  Performed  Maximum sterile technique was used including gloves and mask.  Glide scope 3, Grade 1 view  Evaluation Hemodynamic Status: BP stable throughout; O2 sats: stable throughout Patient's Current Condition: stable Complications: No apparent complications Patient did tolerate procedure well. Chest X-ray ordered to verify placement.  CXR: pending.  Marshell Garfinkel MD [facility] Pager [phone] If no answer or after 3pm call: [phone] 05/10/2017, 4:18 PM

## 2017-05-10 NOTE — ED Notes (Signed)
Patient transported to CT 

## 2017-05-10 NOTE — Progress Notes (Signed)
eLink Physician-Brief Progress Note Patient Name: Sean Shannon DOB: Sep 02, 1955 MRN: [id number]   Date of Service  05/10/2017  HPI/Events of Note  Decrease LOC - Improved with Narcan 0.4 mg IV X 1.   eICU Interventions  Will order: 1. Narcan 0.4 mg IV PRN decreased LOC. 2. Increase Narcan IV infusion to 1.5 mg/hour.      Intervention Category Major Interventions: Change in mental status - evaluation and management  [name] 05/10/2017, 6:26 AM
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
## 2017-05-10 NOTE — ED Notes (Signed)
ED Provider at bedside. 
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
## 2017-05-10 NOTE — Procedures (Signed)
  ELECTROENCEPHALOGRAM REPORT  Date of Study: 05/10/17  Patient's Name: Sean Shannon MRN: [id number] Date of Birth: [birth year]  Referring Provider: Kathrynn Speed, MD  Clinical History: MOODY ROBBEN is a 61 y.o.  male former smoker with hx of drug abuse brought to ER with altered mental status.  UDS positive for opiates, cocaine, THC.  He was started on narcan gtt. PMHx of depression, gonorrhea, hepatitis, HLD, HTN   Medications: Scheduled Meds: . ammonia      . aspirin  324 mg Oral NOW   Or  . aspirin  300 mg Rectal NOW  . chlorhexidine gluconate (MEDLINE KIT)  15 mL Mouth Rinse BID  . enoxaparin (LOVENOX) injection  40 mg Subcutaneous Q24H  . fentaNYL (SUBLIMAZE) injection  100 mcg Intravenous Once  . mouth rinse  15 mL Mouth Rinse QID  . midazolam  2 mg Intravenous Once  . naloxone      . naloxone      . pantoprazole (PROTONIX) IV  40 mg Intravenous Daily   Continuous Infusions: . sodium chloride    . naLOXone [facility]) adult infusion for OVERDOSE Stopped (05/10/17 1338)   PRN Meds:.docusate, fentaNYL (SUBLIMAZE) injection, fentaNYL (SUBLIMAZE) injection, labetalol, midazolam, midazolam, naLOXone (NARCAN)  injection            Technical Summary: This is a standard 16 channel EEG recording performed according to the international 10-20 electrode system.  AP bipolar, transverse bipolar, and referential montages were obtained, and digitally reformatted as necessary.  Duration of tracing: 21:13  Description: The predominate feature is a 6 Hz, posterior predominant, symmetric theta rhythm that persists throughout the tracing.  Background is slightly disorganized.  P3 electrode had to be reapplied during the tracing. No well defined drowsiness or stage 2 sleep.   Hyperventilation and photic stimulation were not performed. The patients name was called, and feet stimulated, however no appreciable background reactivity was noted. EKG was monitored and noted to be  sinus rhythym with an average heart rate of 90 bpm.  No epileptiform changes were noted.  Impression: This is an abnormal EEG due to background slowing seen throughout the tracing.  This is a non-specific finding that can be seen with toxic, metabolic, diffuse, or multifocal structural processes.  No definite epileptiform changes were noted.   A single EEG without epileptiform changes does not exclude the diagnosis of epilepsy. Study was limited by lack of hyperventilation or photic stimulation.  Clinical correlation advised.   Carvel Getting, M.D. Neurology

## 2017-05-10 NOTE — Progress Notes (Signed)
eLink Physician-Brief Progress Note Patient Name: ARLESS VINEYARD DOB: 1955-12-02 MRN: [id number]   Date of Service  05/10/2017  HPI/Events of Note  Oliguria  eICU Interventions  Will bolus with 0.9 NaCl 1 liter IV over 1 hour now.      Intervention Category Intermediate Interventions: Oliguria - evaluation and management  Sutton Hirsch [place] 05/10/2017, 11:15 PM

## 2017-05-10 NOTE — Progress Notes (Signed)
While cleaning pt, pt's went into 3rd degree heart block for a short period.  Now, pt is in and out of 2nd degree mobitz II and NSR.  Pacer pads on pt's chest and crash cart at door.  Notified Dr. Pearline Cables of pt's heart rhythm change.  Ordered Cardiac enzymes and ekg.  Will monitor pt closely.

## 2017-05-10 NOTE — Progress Notes (Addendum)
Patient seen for evaluation for admission at request of Dr. Wyvonnia Dusky.  On arrival to patient bedside, patient noted to be apenic by myself and Dr. Leonel Ramsay (who was also evaluating patient at the time), with maybe one respiration every 20 seconds or so.  Painful stimuli would cause brief (couple second) awakening, followed by return to unconsciousness and apnea.  Given this and UDS + for opiates, patient given 2 rounds of 0.2 mg iv narcan.  After second round patient woke up, became combative requiring multiple staff members to restrain for a short period of time before falling back asleep several minutes later.  Eventually after about 20 to 30 mins, the patient's respiratory rate began to drop again presumably as the narcan wore off, and so narcan gtt started.  Dr. Wyvonnia Dusky is calling PCCM at this time.

## 2017-05-10 NOTE — Progress Notes (Signed)
After observing pt and assessing throughout shift, noticed correlation with heart block and pt vagalinng.  First episode, around 1010  was right before large hard stool noted from rectum.  Next episode around 1300 was when pt was heaving and vomiting and last episode toward end of shift was with straining cough.  Pacer pads remain on pt with pacer off.  Will report findings to oncoming nurse

## 2017-05-10 NOTE — Progress Notes (Signed)
Pt with large vomiting episode. Remains unresponsive despite narcan drip.  He required intubation to protect airway .  Vent orders Arletha Grippe [place] Baseman cxr. Lafe Garin /Versed As needed  .
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
## 2017-05-10 NOTE — Progress Notes (Signed)
EEG completed, results pending. 

## 2017-05-11 ENCOUNTER — Inpatient Hospital Stay (HOSPITAL_COMMUNITY): Payer: Non-veteran care

## 2017-05-11 DIAGNOSIS — G9340 Encephalopathy, unspecified: Secondary | ICD-10-CM | POA: Diagnosis not present

## 2017-05-11 DIAGNOSIS — R4182 Altered mental status, unspecified: Secondary | ICD-10-CM | POA: Diagnosis not present

## 2017-05-11 LAB — CBC
HEMATOCRIT: 37.8 % — AB (ref 39.0–52.0)
Hemoglobin: 12.4 g/dL — ABNORMAL LOW (ref 13.0–17.0)
MCH: 28.4 pg (ref 26.0–34.0)
MCHC: 32.8 g/dL (ref 30.0–36.0)
MCV: 86.7 fL (ref 78.0–100.0)
PLATELETS: 202 10*3/uL (ref 150–400)
RBC: 4.36 MIL/uL (ref 4.22–5.81)
RDW: 12.9 % (ref 11.5–15.5)
WBC: 13.8 10*3/uL — AB (ref 4.0–10.5)

## 2017-05-11 LAB — BASIC METABOLIC PANEL
ANION GAP: 5 (ref 5–15)
BUN: 15 mg/dL (ref 6–20)
CALCIUM: 8.4 mg/dL — AB (ref 8.9–10.3)
CO2: 28 mmol/L (ref 22–32)
Chloride: 105 mmol/L (ref 101–111)
Creatinine, Ser: 0.96 mg/dL (ref 0.61–1.24)
Glucose, Bld: 124 mg/dL — ABNORMAL HIGH (ref 65–99)
POTASSIUM: 3.9 mmol/L (ref 3.5–5.1)
Sodium: 138 mmol/L (ref 135–145)

## 2017-05-11 LAB — BLOOD GAS, ARTERIAL
ACID-BASE EXCESS: 4.1 mmol/L — AB (ref 0.0–2.0)
BICARBONATE: 27.9 mmol/L (ref 20.0–28.0)
Drawn by: 36277
FIO2: 40
LHR: 14 {breaths}/min
MECHVT: 580 mL
O2 Saturation: 98.4 %
PEEP/CPAP: 5 cmH2O
Patient temperature: 98.6
pCO2 arterial: 40.5 mmHg (ref 32.0–48.0)
pH, Arterial: 7.453 — ABNORMAL HIGH (ref 7.350–7.450)
pO2, Arterial: 120 mmHg — ABNORMAL HIGH (ref 83.0–108.0)

## 2017-05-11 LAB — PHOSPHORUS: Phosphorus: 2.6 mg/dL (ref 2.5–4.6)

## 2017-05-11 LAB — MAGNESIUM: Magnesium: 1.8 mg/dL (ref 1.7–2.4)

## 2017-05-11 LAB — CK: Total CK: 141 U/L (ref 49–397)

## 2017-05-11 MED ORDER — THIAMINE HCL 100 MG/ML IJ SOLN
100.0000 mg | Freq: Every day | INTRAMUSCULAR | Status: DC
  Administered 2017-05-11 – 2017-05-12 (×2): 100 mg via INTRAVENOUS
  Filled 2017-05-11 (×2): qty 2
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
## 2017-05-11 NOTE — Procedures (Signed)
Pt transported to MRI and back to 4N on vent, no complications
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
## 2017-05-11 NOTE — Procedures (Signed)
Extubation Procedure Note  Patient Details:   Name: Sean Shannon DOB: 23-Jun-1955 MRN: [id number]   Airway Documentation:     Evaluation  O2 sats: stable throughout Complications: No apparent complications Patient did tolerate procedure well. Bilateral Breath Sounds: Clear   Yes   Patient extubated to 2L nasal cannula per MD order.  Positive cuff leak noted.  No evidence of stridor.  Patient able to speak post extubation.  Sats currently 99%.  Vitals are stable.  Incentive spirometry performed x10 with achieved goal of 1250.  No complications noted.  Philomena Doheny 05/11/2017, 2:38 PM

## 2017-05-12 DIAGNOSIS — R4182 Altered mental status, unspecified: Secondary | ICD-10-CM | POA: Diagnosis not present

## 2017-05-12 MED ORDER — AMLODIPINE BESYLATE 10 MG PO TABS
10.0000 mg | ORAL_TABLET | Freq: Every day | ORAL | Status: DC
  Administered 2017-05-12: 10 mg via ORAL
  Filled 2017-05-12 (×4): qty 1

## 2017-05-12 MED ORDER — HYDROCHLOROTHIAZIDE 25 MG PO TABS
25.0000 mg | ORAL_TABLET | Freq: Every day | ORAL | Status: DC
  Administered 2017-05-12: 25 mg via ORAL
  Filled 2017-05-12 (×3): qty 1

## 2017-05-12 MED ORDER — HYDRALAZINE HCL 20 MG/ML IJ SOLN
10.0000 mg | INTRAMUSCULAR | Status: DC | PRN
  Administered 2017-05-12: 10 mg via INTRAVENOUS
  Filled 2017-05-12: qty 1

## 2017-05-12 MED ORDER — ENSURE ENLIVE PO LIQD
237.0000 mL | Freq: Two times a day (BID) | ORAL | Status: DC
  Administered 2017-05-12: 237 mL via ORAL

## 2017-05-12 MED ORDER — TERAZOSIN HCL 2 MG PO CAPS
2.0000 mg | ORAL_CAPSULE | Freq: Every day | ORAL | Status: DC
  Administered 2017-05-12: 2 mg via ORAL
  Filled 2017-05-12 (×2): qty 1

## 2017-05-12 MED ORDER — SERTRALINE HCL 100 MG PO TABS
100.0000 mg | ORAL_TABLET | Freq: Every day | ORAL | Status: DC
  Administered 2017-05-12: 100 mg via ORAL
  Filled 2017-05-12 (×2): qty 1

## 2017-05-12 NOTE — Progress Notes (Signed)
Pt received from 4N alert with no noted distress. Pt stable, neuro intact. Pt oriented to room. Safety measures in place. Call bell within reach. Will continue to monitor.
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
## 2017-05-13 DIAGNOSIS — G9340 Encephalopathy, unspecified: Secondary | ICD-10-CM | POA: Diagnosis not present

## 2017-05-13 MED ORDER — VITAMIN B-1 100 MG PO TABS
100.0000 mg | ORAL_TABLET | Freq: Every day | ORAL | Status: DC
  Administered 2017-05-13: 100 mg via ORAL
  Filled 2017-05-13: qty 1

## 2017-05-13 MED ORDER — PANTOPRAZOLE SODIUM 40 MG PO TBEC
40.0000 mg | DELAYED_RELEASE_TABLET | Freq: Every day | ORAL | Status: DC
  Administered 2017-05-13: 40 mg via ORAL
  Filled 2017-05-13: qty 1

## 2017-05-13 NOTE — Discharge Summary (Signed)
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 05/09/17 2311 05/10/17 0505 05/10/17 1010 05/10/17 1555 05/10/17 2207 05/11/17 0248  CKTOTAL 266  --  193 155 133 141  CKMB  --   --  5.6* 4.0 4.2  --   TROPONINI  --  <0.03  --  <0.03 <0.03  --    BNP: BNP (last 3 results) No results for input(s): BNP in the last 8760 hours.  ProBNP (last 3 results) No results for input(s): PROBNP in the last 8760 hours.  CBG: No results for input(s): GLUCAP in the last 168 hours.   Signed:  Velvet Bathe MD.  [facility] 05/13/2017, 3:29 PM

## 2017-05-13 NOTE — Progress Notes (Signed)
Instructions reviewed with patient/family All questions answered at this time. No concerns/complaints voice. Transport home by family.   Ave Filter, RN

## 2017-12-07 ENCOUNTER — Other Ambulatory Visit: Payer: Self-pay

## 2017-12-07 ENCOUNTER — Emergency Department (HOSPITAL_COMMUNITY)
Admission: EM | Admit: 2017-12-07 | Discharge: 2017-12-07 | Disposition: A | Discharge status: Home / Self Care | Payer: Non-veteran care | Attending: Emergency Medicine | Admitting: Emergency Medicine

## 2017-12-07 ENCOUNTER — Encounter (HOSPITAL_COMMUNITY): Payer: Self-pay | Admitting: Obstetrics and Gynecology

## 2017-12-07 DIAGNOSIS — R600 Localized edema: Secondary | ICD-10-CM | POA: Diagnosis not present

## 2017-12-07 DIAGNOSIS — Z79899 Other long term (current) drug therapy: Secondary | ICD-10-CM | POA: Insufficient documentation

## 2017-12-07 DIAGNOSIS — I10 Essential (primary) hypertension: Secondary | ICD-10-CM | POA: Diagnosis not present

## 2017-12-07 DIAGNOSIS — Z87891 Personal history of nicotine dependence: Secondary | ICD-10-CM | POA: Insufficient documentation

## 2017-12-07 DIAGNOSIS — W57XXXA Bitten or stung by nonvenomous insect and other nonvenomous arthropods, initial encounter: Secondary | ICD-10-CM

## 2017-12-07 NOTE — Discharge Instructions (Addendum)
No need for antibiotics. Tick was not attached to the skin long enough. I have included information that you requested about how to prevent tick bites.

## 2017-12-07 NOTE — ED Triage Notes (Signed)
Pt reports he was working outside cutting trees and found a tick on his stomach and he tried to pull it out, but believes the head may still be in there.

## 2017-12-07 NOTE — ED Provider Notes (Signed)
[place] DEPT Provider Note  CSN: [id number] Arrival date & time: 12/07/17  1807    History   Chief Complaint Chief Complaint  Patient presents with  . Insect Bite    HPI Sean Shannon is a 62 y.o. male with a medical history of Hepatitis C, HTN and HLD who presented to the ED for tick bite. Patient was working in his yard today when he noticed a tick on his stomach. Patient states he pulled the tick out himself with tweezers, but believe the head of the tick is still attached. Tick was on the body for ~ 30 minutes. Denies other rashes, lesions or bites. Denies fever, arthralgias, paresthesias, headaches or neuro deficits.  Past Medical History:  Diagnosis Date  . Anxiety   . Borderline diabetes   . Chest pain   . Depression   . Gonorrhea    remote h/o gonorrhea which was treated as per patient  . Headache(784.0)   . Hepatitis    Hepatitis C- took Harvani medication  . Hyperlipidemia   . Hypertension     Patient Active Problem List   Diagnosis Date Noted  . Altered mental state 05/10/2017  . Acute encephalopathy   . Acute respiratory failure with hypoxia ([place])   . Dilated cbd, acquired   . Pancreatic duct dilated   . Erectile dysfunction 09/02/2012  . Preventive measure 11/20/2010  . Hepatitis C antibody test positive 10/29/2010  . SUBSTANCE ABUSE, MULTIPLE 12/12/2009  . BENIGN PROSTATIC HYPERTROPHY, HX OF 08/02/2009  . HYPERLIPIDEMIA 09/24/2006  . ALLERGIC RHINITIS, SEASONAL 09/24/2006  . HYPERTENSION 09/10/2006  . TB SKIN TEST, POSITIVE, HX OF 09/10/2006    Past Surgical History:  Procedure Laterality Date  . EUS N/A 04/03/2016   Procedure: UPPER ENDOSCOPIC ULTRASOUND (EUS) RADIAL;  Surgeon: Milus Banister, MD;  Location: [facility] ENDOSCOPY;  Service: Endoscopy;  Laterality: N/A;  . SPINAL FIXATION SURGERY    . TONSILECTOMY, ADENOIDECTOMY, BILATERAL MYRINGOTOMY AND TUBES  1959  . TONSILLECTOMY          Home Medications     Prior to Admission medications   Medication Sig Start Date End Date Taking? Authorizing Provider  amLODipine (NORVASC) 10 MG tablet Take 10 mg by mouth daily after breakfast.    [provider]  aspirin 325 MG EC tablet Take 325 mg by mouth every 6 (six) hours as needed for pain.    [provider]  Carboxymethylcellulose Sodium (THERATEARS OP) Place 1 drop into both eyes daily as needed (dry eyes).    [provider]  Cholecalciferol (VITAMIN D) 2000 units CAPS Take 2,000 Units by mouth daily after breakfast.    [provider]  hydrochlorothiazide (HYDRODIURIL) 25 MG tablet Take 1 tablet (25 mg total) by mouth daily. 09/02/12   Trish Fountain, MD  sertraline (ZOLOFT) 100 MG tablet Take 100 mg by mouth daily after breakfast.    [provider]  terazosin (HYTRIN) 2 MG capsule Take 4 mg by mouth at bedtime.     [provider]    Family History Family History  Problem Relation Age of Onset  . Diabetes Unknown        Family history    Social History Social History   Tobacco Use  . Smoking status: Former Smoker    Types: Cigarettes    Last attempt to quit: 06/09/1993    Years since quitting: 24.5  . Smokeless tobacco: Never Used  Substance Use Topics  . Alcohol use:  No  . 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   Musculoskeletal: Negative.   Skin:       Tick bite on abdomen.  Neurological: Negative.   Hematological: Negative.      Physical Exam Updated Vital Signs BP (!) 143/88 (BP Location: Right Arm)   Pulse 76   Temp 98.9 F (37.2 C) (Oral)   Resp 14   Ht 5\' 10"  (1.778 m)   Wt 68 kg (150 lb)   SpO2 98%   BMI 21.52 kg/m   Physical Exam  Constitutional: He appears well-developed and well-nourished. No distress.  HENT:  Right Ear:  Hearing, tympanic membrane, external ear and ear canal normal.  Left Ear: Hearing, tympanic membrane, external ear and ear canal normal.  Mouth/Throat: Uvula is midline, oropharynx is clear and moist and mucous membranes are normal. No tonsillar exudate.  Eyes: Pupils are equal, round, and reactive to light. Conjunctivae and EOM are normal.  Neck: Normal range of motion. Neck supple.  Cardiovascular: Normal rate, regular rhythm and normal heart sounds.  Skin: Skin is warm. No rash noted.     ~2cm area of erythema in LLQ. Bite location seen with black dot. No blood or drainage from site. No other rashes or lesions seen on skin.  Nursing note and vitals reviewed.    ED Treatments / Results  Labs (all labs ordered are listed, but only abnormal results are displayed) Labs Reviewed - No data to display  EKG None  Radiology No results found.  Procedures Procedures (including critical care time)  Medications Ordered in ED Medications - No data to display   Initial Impression / Assessment and Plan / ED Course  Triage vital signs and the nursing notes have been reviewed.  Pertinent labs & imaging results that were available during care of the patient were reviewed and considered in medical decision making (see chart for details).   Patient presents with concern of the tick head still being attached to his body. He states he attempted to remove the tick himself at home. On physical exam, there is an area of erythema in the LLQ where patient used tweezers to remove the tick. Unclear if head was attached or if the portion removed today was just the beginning stages of scab formation. Bite site was cleaned and it is clear that there is no longer any ticks attached. No indications for antibiotics since tick was attached for < 48 hours.  Final Clinical Impressions(s) / ED Diagnoses  1. Tick Bite. Tick full removed from body. Education provided on appropriate measures to prevent tick  bites.  Dispo: Home. After thorough clinical evaluation, this patient is determined to be medically stable and can be safely discharged with the previously mentioned treatment and/or outpatient follow-up/referral(s). At this time, there are no other apparent medical conditions that require further screening, evaluation or treatment.   Final diagnoses:  Tick bite, initial encounter    ED Discharge Orders    None        Junita Push 12/07/17 2022    Davonna Belling, MD 12/07/17 2333

## 2018-03-17 ENCOUNTER — Other Ambulatory Visit (HOSPITAL_COMMUNITY): Payer: Self-pay | Admitting: Internal Medicine

## 2018-03-17 DIAGNOSIS — K7469 Other cirrhosis of liver: Secondary | ICD-10-CM

## 2018-03-22 ENCOUNTER — Ambulatory Visit (HOSPITAL_COMMUNITY)
Admission: RE | Admit: 2018-03-22 | Discharge: 2018-03-22 | Disposition: A | Discharge status: Home / Self Care | Payer: No Typology Code available for payment source | Source: Ambulatory Visit | Attending: Internal Medicine | Admitting: Internal Medicine

## 2018-03-22 DIAGNOSIS — K7469 Other cirrhosis of liver: Secondary | ICD-10-CM | POA: Insufficient documentation
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
## 2018-12-17 IMAGING — DX DG CHEST 1V PORT
1 series · 1 of 1 positions shown · non-contrast
Comparison: 05/10/2017

CLINICAL DATA: Respiratory failure.

EXAM:
PORTABLE CHEST 1 VIEW

[chest ap]
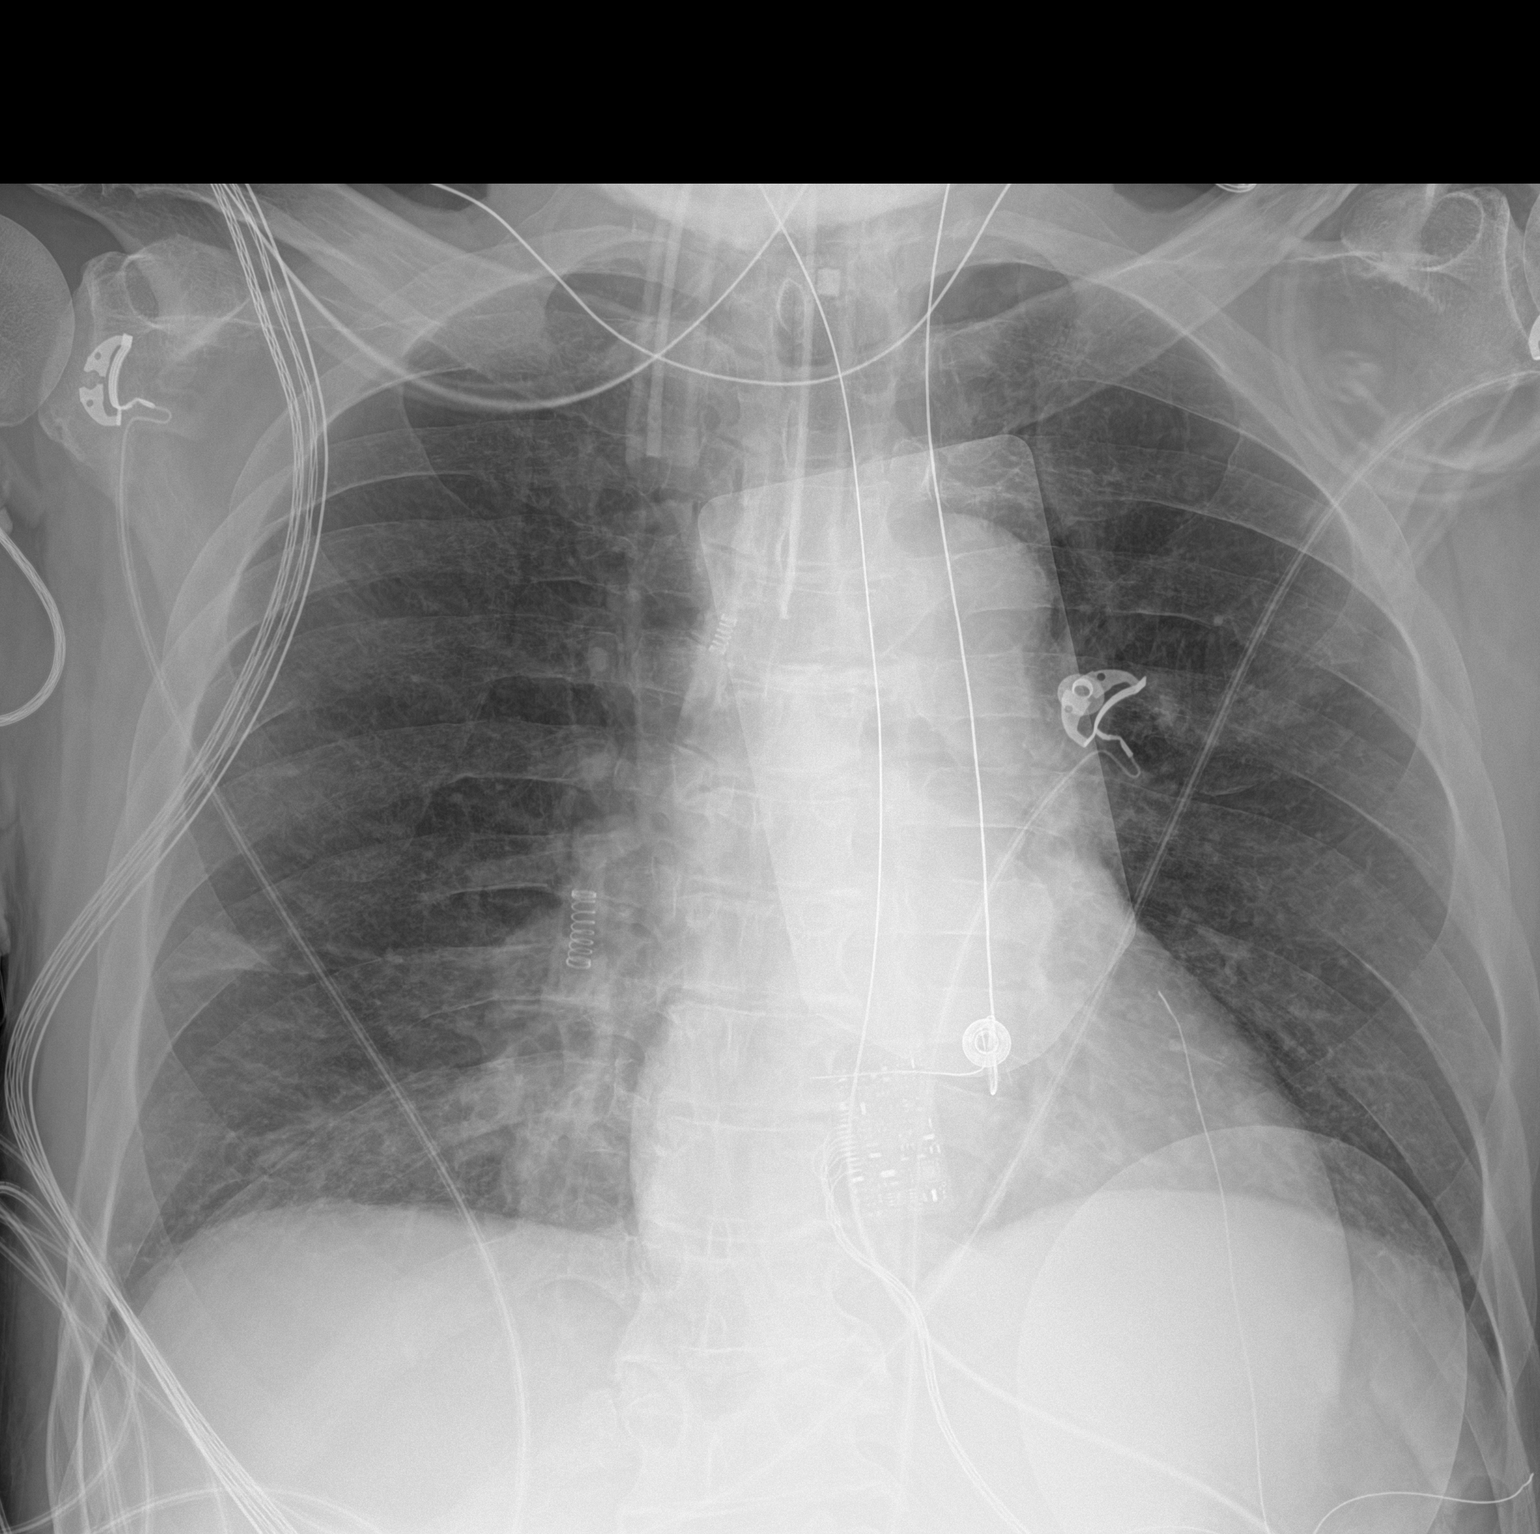

[1 of 1 positions shown; findings below may reference images not displayed]

FINDINGS: Endotracheal tube is roughly 1.8 cm above the carina. Nasogastric
tube extends into the abdomen. Increased linear densities in the
right lower chest. Heart size is within normal limits. Cardiac pads
overlying the chest. Negative for a pneumothorax.
IMPRESSION: New or increased densities in the right lower chest. Findings are
most compatible with atelectasis.

Support apparatuses as described.

## 2020-01-14 ENCOUNTER — Other Ambulatory Visit: Payer: Self-pay

## 2020-01-14 DIAGNOSIS — W57XXXA Bitten or stung by nonvenomous insect and other nonvenomous arthropods, initial encounter: Secondary | ICD-10-CM | POA: Diagnosis not present

## 2020-01-14 DIAGNOSIS — Y9389 Activity, other specified: Secondary | ICD-10-CM | POA: Insufficient documentation

## 2020-01-14 DIAGNOSIS — Z79899 Other long term (current) drug therapy: Secondary | ICD-10-CM | POA: Diagnosis not present

## 2020-01-14 DIAGNOSIS — Z87891 Personal history of nicotine dependence: Secondary | ICD-10-CM | POA: Diagnosis not present

## 2020-01-14 DIAGNOSIS — Y9289 Other specified places as the place of occurrence of the external cause: Secondary | ICD-10-CM | POA: Insufficient documentation

## 2020-01-14 DIAGNOSIS — S60463A Insect bite (nonvenomous) of left middle finger, initial encounter: Secondary | ICD-10-CM | POA: Diagnosis not present

## 2020-01-14 DIAGNOSIS — Y998 Other external cause status: Secondary | ICD-10-CM | POA: Insufficient documentation

## 2020-01-14 DIAGNOSIS — I10 Essential (primary) hypertension: Secondary | ICD-10-CM | POA: Diagnosis not present

## 2020-01-14 NOTE — ED Triage Notes (Signed)
Patient complains of hornet sting x3 hours ago. Patient complains of numbness/tingling to middle finger, full ROM, PMS intact. Patient is AxOx4.

## 2020-01-15 ENCOUNTER — Emergency Department (HOSPITAL_COMMUNITY)
Admission: EM | Admit: 2020-01-15 | Discharge: 2020-01-15 | Disposition: A | Discharge status: Home / Self Care | Payer: No Typology Code available for payment source | Attending: Emergency Medicine | Admitting: Emergency Medicine

## 2020-01-15 DIAGNOSIS — T63441A Toxic effect of venom of bees, accidental (unintentional), initial encounter: Secondary | ICD-10-CM

## 2020-01-15 NOTE — Discharge Instructions (Addendum)
You may take Tylenol as needed for pain.  You may take up to 2 pills of extra strength Tylenol every 8 hours.  Do not take more than 6 extra strength pills in a 24-hour.  Or you can harm your liver.  Do not drink alcohol while taking Tylenol.  If you develop itching you can take 1 to 2 pills of Benadryl (25 to 50 mg).  If you develop body wide hives, shortness of breath, vomiting, diarrhea, or have other new or concerning symptoms please seek additional medical care and evaluation.  While in the emergency room your blood pressure is elevated.  This is most likely due to the stress of being in the emergency room and being in pain.  Please get it rechecked in the next month as if it remains elevated you may need medications to help prevent permanent damage.

## 2020-01-15 NOTE — ED Provider Notes (Signed)
[place] DEPT Provider Note   CSN: [id number] Arrival date & time: 01/14/20  2317     History Chief Complaint  Patient presents with  . Insect Bite    Sean Shannon is a 63 y.o. male who presents today for evaluation of a reported hornet sting to his left middle finger.  This occurred about 5 hours prior to my evaluation.  He reports pain in the middle finger that is made better with ice.  He denies any cough, shortness of breath.  No chest pain, no abdominal pain, nausea, vomiting, or diarrhea.  No feelings of throat, tongue, or lip swelling.  He has never had an anaphylactic reaction to bee stings before.  He states that he is here because he felt like this sting was more painful than his previous stings have been in the past.  Of note he brings the hornet in a plastic bag here with him today.      HPI     Past Medical History:  Diagnosis Date  . Anxiety   . Borderline diabetes   . Chest pain   . Depression   . Gonorrhea    remote h/o gonorrhea which was treated as per patient  . Headache(784.0)   . Hepatitis    Hepatitis C- took Harvani medication  . Hyperlipidemia   . Hypertension     Patient Active Problem List   Diagnosis Date Noted  . Altered mental state 05/10/2017  . Acute encephalopathy   . Acute respiratory failure with hypoxia ([place])   . Dilated cbd, acquired   . Pancreatic duct dilated   . Erectile dysfunction 09/02/2012  . Preventive measure 11/20/2010  . Hepatitis C antibody test positive 10/29/2010  . SUBSTANCE ABUSE, MULTIPLE 12/12/2009  . BENIGN PROSTATIC HYPERTROPHY, HX OF 08/02/2009  . HYPERLIPIDEMIA 09/24/2006  . ALLERGIC RHINITIS, SEASONAL 09/24/2006  . HYPERTENSION 09/10/2006  . TB SKIN TEST, POSITIVE, HX OF 09/10/2006    Past Surgical History:  Procedure Laterality Date  . EUS N/A 04/03/2016   Procedure: UPPER ENDOSCOPIC ULTRASOUND (EUS) RADIAL;  Surgeon: Milus Banister, MD;  Location: [facility]  ENDOSCOPY;  Service: Endoscopy;  Laterality: N/A;  . SPINAL FIXATION SURGERY    . TONSILECTOMY, ADENOIDECTOMY, BILATERAL MYRINGOTOMY AND TUBES  1959  . TONSILLECTOMY         Family History  Problem Relation Age of Onset  . Diabetes Unknown        Family history    Social History   Tobacco Use  . Smoking status: Former Smoker    Types: Cigarettes    Quit date: 06/09/1993    Years since quitting: 26.6  . Smokeless tobacco: Never Used  Substance Use Topics  . Alcohol use: No  . Drug use: Yes    Types: Cocaine, Heroin    Comment: Hx of heroine for 1 year. Quit 1 month ago cocaine and heroin    Home Medications Prior to Admission medications   Medication Sig Start Date End Date Taking? Authorizing Provider  amLODipine (NORVASC) 10 MG tablet Take 10 mg by mouth daily after breakfast.    [provider]  aspirin 325 MG EC tablet Take 325 mg by mouth every 6 (six) hours as needed for pain.    [provider]  Carboxymethylcellulose Sodium (THERATEARS OP) Place 1 drop into both eyes daily as needed (dry eyes).    [provider]  Cholecalciferol (VITAMIN D) 2000 units CAPS Take 2,000 Units by mouth daily after  breakfast.    [provider]  hydrochlorothiazide (HYDRODIURIL) 25 MG tablet Take 1 tablet (25 mg total) by mouth daily. 09/02/12   Trish Fountain, MD  sertraline (ZOLOFT) 100 MG tablet Take 100 mg by mouth daily after breakfast.    [provider]  terazosin (HYTRIN) 2 MG capsule Take 4 mg by mouth at bedtime.     [provider]    Allergies    Trazodone  Review of Systems   Review of Systems  Constitutional: Negative for chills and fever.  HENT: Negative for congestion and trouble swallowing.   Respiratory: Negative for cough and shortness of breath.   Gastrointestinal: Negative for abdominal pain, diarrhea, nausea and vomiting.  Skin: Positive for color change.  Neurological: Negative for weakness,  light-headedness and headaches.  All other systems reviewed and are negative.   Physical Exam Updated Vital Signs BP (!) 185/96 (BP Location: Right Arm)   Pulse 95   Temp 98 F (36.7 C) (Oral)   Resp 20   Ht 5\' 11"  (1.803 m)   Wt 70.3 kg   SpO2 93%   BMI 21.62 kg/m   Physical Exam Vitals and nursing note reviewed.  Constitutional:      General: He is not in acute distress.    Appearance: He is well-developed. He is not diaphoretic.  HENT:     Head: Normocephalic and atraumatic.  Eyes:     General: No scleral icterus.       Right eye: No discharge.        Left eye: No discharge.     Conjunctiva/sclera: Conjunctivae normal.  Cardiovascular:     Rate and Rhythm: Normal rate and regular rhythm.     Pulses: Normal pulses.     Heart sounds: Normal heart sounds.  Pulmonary:     Effort: Pulmonary effort is normal. No respiratory distress.     Breath sounds: Normal breath sounds. No stridor.  Abdominal:     General: There is no distension.  Musculoskeletal:        General: No deformity.     Cervical back: Normal range of motion and neck supple.     Comments: Mild edema around the left long finger.  He is able to bend the finger and making a fist.  Skin:    General: Skin is warm and dry.     Comments: Mild erythema around the right long finger.  Small superficial wound visualized over the palmar surface of the middle phalanges of the right long finger.  No stinger is visualized in the wound. Capillary refill is under 2 seconds to the distal tip of the left long finger.  Neurological:     General: No focal deficit present.     Mental Status: He is alert.     Motor: No abnormal muscle tone.  Psychiatric:        Mood and Affect: Mood normal.        Behavior: Behavior normal.     ED Results / Procedures / Treatments   Labs (all labs ordered are listed, but only abnormal results are displayed) Labs Reviewed - No data to display  EKG None  Radiology No results  found.  Procedures Procedures (including critical care time)  Medications Ordered in ED Medications - No data to display  ED Course  I have reviewed the triage vital signs and the nursing notes.  Pertinent labs & imaging results that were available during my care of the patient were reviewed by  me and considered in my medical decision making (see chart for details).    MDM Rules/Calculators/A&P                          Patient is a 64 year old man who presents today for evaluation after a hornet sting.  He brought the hornet with him in a Ziploc bag today.  On my exam he has mild edema, tenderness to palpation and erythema around the left long finger.  He has been more than 4 hours since he was stung, does not have a history of anaphylaxis, has not had any medications such as Benadryl or steroids and does not have any evidence of a systemic reaction.  Recommended conservative measures including ice, OTC medications.  I advised him, in the future, please do not bring stinging insects or animals that may cause harm into the emergency room.  Return precautions were discussed with patient who states their understanding.  At the time of discharge patient denied any unaddressed complaints or concerns.  Patient is agreeable for discharge home.  Note: Portions of this report may have been transcribed using voice recognition software. Every effort was made to ensure accuracy; however, inadvertent computerized transcription errors may be present   Final Clinical Impression(s) / ED Diagnoses Final diagnoses:  Bee sting, accidental or unintentional, initial encounter    Rx / DC Orders ED Discharge Orders    None       Ollen Gross 01/15/20 0729    Fatima Blank, MD 01/15/20 1850

## 2020-03-08 DIAGNOSIS — M48061 Spinal stenosis, lumbar region without neurogenic claudication: Secondary | ICD-10-CM | POA: Insufficient documentation

## 2020-05-19 ENCOUNTER — Emergency Department (HOSPITAL_COMMUNITY)
Admission: EM | Admit: 2020-05-19 | Discharge: 2020-05-19 | Disposition: A | Discharge status: Home / Self Care | Payer: No Typology Code available for payment source | Attending: Emergency Medicine | Admitting: Emergency Medicine

## 2020-05-19 ENCOUNTER — Encounter (HOSPITAL_COMMUNITY): Payer: Self-pay

## 2020-05-19 ENCOUNTER — Emergency Department (HOSPITAL_COMMUNITY): Payer: No Typology Code available for payment source

## 2020-05-19 ENCOUNTER — Other Ambulatory Visit: Payer: Self-pay

## 2020-05-19 DIAGNOSIS — S61012A Laceration without foreign body of left thumb without damage to nail, initial encounter: Secondary | ICD-10-CM | POA: Diagnosis not present

## 2020-05-19 DIAGNOSIS — W25XXXA Contact with sharp glass, initial encounter: Secondary | ICD-10-CM | POA: Insufficient documentation

## 2020-05-19 DIAGNOSIS — Z87891 Personal history of nicotine dependence: Secondary | ICD-10-CM | POA: Insufficient documentation

## 2020-05-19 DIAGNOSIS — Z79899 Other long term (current) drug therapy: Secondary | ICD-10-CM | POA: Diagnosis not present

## 2020-05-19 DIAGNOSIS — Z7982 Long term (current) use of aspirin: Secondary | ICD-10-CM | POA: Diagnosis not present

## 2020-05-19 DIAGNOSIS — I10 Essential (primary) hypertension: Secondary | ICD-10-CM | POA: Insufficient documentation

## 2020-05-19 MED ORDER — BUPIVACAINE HCL (PF) 0.5 % IJ SOLN
5.0000 mL | Freq: Once | INTRAMUSCULAR | Status: AC
  Administered 2020-05-19: 5 mL
  Filled 2020-05-19: qty 30

## 2020-05-19 MED ORDER — LIDOCAINE HCL (PF) 1 % IJ SOLN
5.0000 mL | Freq: Once | INTRAMUSCULAR | Status: AC
  Administered 2020-05-19: 5 mL
  Filled 2020-05-19: qty 30

## 2020-05-19 MED ORDER — CEPHALEXIN 500 MG PO CAPS
500.0000 mg | ORAL_CAPSULE | Freq: Once | ORAL | Status: AC
  Administered 2020-05-19: 500 mg via ORAL
  Filled 2020-05-19: qty 1

## 2020-05-19 MED ORDER — PENTAFLUOROPROP-TETRAFLUOROETH EX AERO
INHALATION_SPRAY | Freq: Once | CUTANEOUS | Status: DC
  Filled 2020-05-19: qty 103.5

## 2020-05-19 MED ORDER — HYDROCODONE-ACETAMINOPHEN 5-325 MG PO TABS: 1.0000 | ORAL_TABLET | Freq: Every evening | ORAL | 0 refills | Status: AC | PRN

## 2020-05-19 MED ORDER — CEPHALEXIN 500 MG PO CAPS: 500.0000 mg | ORAL_CAPSULE | Freq: Four times a day (QID) | ORAL | 0 refills | Status: AC

## 2020-05-19 NOTE — ED Provider Notes (Signed)
[facility] DEPT Provider Note   CSN: [id number] Arrival date & time: 05/19/20  2034     History Chief Complaint  Patient presents with  . Finger Laceration     Sean Shannon is a 64 y.o. male Sean Shannon is a 64 y.o. male who presents today for evaluation of a laceration of his left thumb.  This occurred about 4 hours prior to arrival when he cut his thumb on a glass.  He denies any weakness.  Chart review shows last tetanus was within the past 5 years.  No other complaints or concerns.  His pain is made worse with movement and touch, made better with being still.   HPI     Past Medical History:  Diagnosis Date  . Anxiety   . Borderline diabetes   . Chest pain   . Depression   . Gonorrhea    remote h/o gonorrhea which was treated as per patient  . Headache(784.0)   . Hepatitis    Hepatitis C- took Harvani medication  . Hyperlipidemia   . Hypertension     Patient Active Problem List   Diagnosis Date Noted  . Altered mental state 05/10/2017  . Acute encephalopathy   . Acute respiratory failure with hypoxia ([place])   . Dilated cbd, acquired   . Pancreatic duct dilated   . Erectile dysfunction 09/02/2012  . Preventive measure 11/20/2010  . Hepatitis C antibody test positive 10/29/2010  . SUBSTANCE ABUSE, MULTIPLE 12/12/2009  . BENIGN PROSTATIC HYPERTROPHY, HX OF 08/02/2009  . HYPERLIPIDEMIA 09/24/2006  . ALLERGIC RHINITIS, SEASONAL 09/24/2006  . HYPERTENSION 09/10/2006  . TB SKIN TEST, POSITIVE, HX OF 09/10/2006    Past Surgical History:  Procedure Laterality Date  . EUS N/A 04/03/2016   Procedure: UPPER ENDOSCOPIC ULTRASOUND (EUS) RADIAL;  Surgeon: Milus Banister, MD;  Location: [facility] ENDOSCOPY;  Service: Endoscopy;  Laterality: N/A;  . SPINAL FIXATION SURGERY    . TONSILECTOMY, ADENOIDECTOMY, BILATERAL MYRINGOTOMY AND TUBES  1959  . TONSILLECTOMY         Family History  Problem Relation Age of Onset  . Diabetes Other         Family history    Social History   Tobacco Use  . Smoking status: Former Smoker    Types: Cigarettes    Quit date: 06/09/1993    Years since quitting: 26.9  . Smokeless tobacco: Never Used  Substance Use Topics  . Alcohol use: No  . Drug use: Yes    Types: Cocaine, Heroin    Comment: Hx of heroine for 1 year. Quit 1 month ago cocaine and heroin    Home Medications Prior to Admission medications   Medication Sig Start Date End Date Taking? Authorizing Provider  amLODipine (NORVASC) 10 MG tablet Take 10 mg by mouth daily after breakfast.    [provider]  aspirin 325 MG EC tablet Take 325 mg by mouth every 6 (six) hours as needed for pain.    [provider]  Carboxymethylcellulose Sodium (THERATEARS OP) Place 1 drop into both eyes daily as needed (dry eyes).    [provider]  cephALEXin (KEFLEX) 500 MG capsule Take 1 capsule (500 mg total) by mouth 4 (four) times daily for 5 days. 05/19/20 05/24/20  [name], PA-C  Cholecalciferol (VITAMIN D) 2000 units CAPS Take 2,000 Units by mouth daily after breakfast.    [provider]  hydrochlorothiazide (HYDRODIURIL) 25 MG tablet Take 1 tablet (25 mg  total) by mouth daily. 09/02/12   Trish Fountain, MD  HYDROcodone-acetaminophen (NORCO/VICODIN) 5-325 MG tablet Take 1 tablet by mouth at bedtime as needed for moderate pain or severe pain. 05/19/20   [name], PA-C  sertraline (ZOLOFT) 100 MG tablet Take 100 mg by mouth daily after breakfast.    [provider]  terazosin (HYTRIN) 2 MG capsule Take 4 mg by mouth at bedtime.     [provider]    Allergies    Trazodone  Review of Systems   Review of Systems Review of Systems  Constitutional: Negative for chills and fever.  Musculoskeletal: Negative for joint swelling.  Skin: Positive for wound.  Neurological: Negative for weakness and numbness.  All other systems reviewed and are  negative.    Physical Exam Updated Vital Signs BP (!) 153/88   Pulse 63   Temp 98.4 F (36.9 C) (Oral)   Resp 17   Ht 5\' 11"  (1.803 m)   Wt 70.3 kg   SpO2 95%   BMI 21.62 kg/m   Physical Exam  Physical Exam Vitals and nursing note reviewed.  Constitutional:      General: He is not in acute distress. HENT:     Head: Normocephalic and atraumatic.  Cardiovascular:     Rate and Rhythm: Normal rate.  Pulmonary:     Effort: Pulmonary effort is normal. No respiratory distress.  Musculoskeletal:     Cervical back: No rigidity.     Comments: Full active range of motion of the left thumb.  Skin:    Comments: C shaped 2 cm laceration over the dorsum of the left thumb ending in the edge of the nail.  Neurological:     Mental Status: He is alert. Mental status is at baseline.     Comments: Awake and alert, answers all questions appropriately.  Speech is not slurred.    Psychiatric:        Mood and Affect: Mood normal.     ED Results / Procedures / Treatments   Labs (all labs ordered are listed, but only abnormal results are displayed) Labs Reviewed - No data to display  EKG None  Radiology DG Finger Thumb Left  Result Date: 05/19/2020 CLINICAL DATA:  Left thumb laceration. EXAM: LEFT THUMB 2+V COMPARISON:  None. FINDINGS: There is no evidence of fracture or dislocation. Minor osteoarthritis of the thumb interphalangeal and metacarpal phalangeal joint. No radiopaque foreign body or tracking soft tissue air. Site of laceration is not well-defined by radiograph. IMPRESSION: 1. No acute fracture. Site of laceration is not well-defined by radiograph. 2. Minor osteoarthritis of the thumb. Electronically Signed   By: Keith Rake M.D.   On: 05/19/2020 21:03    Procedures .Marland KitchenLaceration Repair  Date/Time: 05/19/2020 11:01 PM Performed by: [name], PA-C Authorized by: [name], PA-C   Consent:    Consent obtained:  Verbal   Consent given by:   Patient   Risks, benefits, and alternatives were discussed: yes     Risks discussed:  Infection, need for additional repair, poor cosmetic result, pain, retained foreign body, tendon damage, vascular damage, poor wound healing and nerve damage (Retained glass)   Alternatives discussed:  No treatment and referral (Alternative wound closures) Anesthesia:    Anesthesia method:  Nerve block   Block needle gauge:  25 G   Block anesthetic: Bupivacaine 50% without epi and lidocaine 1% without epi.   Block technique:  Base of left thumb   Block injection  procedure:  Anatomic landmarks identified, anatomic landmarks palpated, introduced needle, negative aspiration for blood and incremental injection   Block outcome:  Anesthesia achieved Laceration details:    Location:  Finger   Finger location:  L thumb   Length (cm):  2 Pre-procedure details:    Preparation:  Patient was prepped and draped in usual sterile fashion Exploration:    Hemostasis achieved with:  Tourniquet   Imaging obtained: x-ray     Imaging outcome: foreign body not noted     Wound exploration: wound explored through full range of motion and entire depth of wound visualized     Wound extent: no foreign bodies/material noted (We discussed at length the possibility of retained glass)   Treatment:    Area cleansed with:  Saline   Amount of cleaning:  Extensive   Irrigation solution:  Sterile saline   Irrigation volume:  1 liter   Debridement:  None Skin repair:    Repair method:  Sutures   Suture size:  4-0   Suture material:  Prolene   Suture technique:  Simple interrupted   Number of sutures:  3 Approximation:    Approximation:  Close Repair type:    Repair type:  Simple Post-procedure details:    Procedure completion:  Tolerated well, no immediate complications   (including critical care time)  Medications Ordered in ED Medications  pentafluoroprop-tetrafluoroeth (GEBAUERS) aerosol (has no administration in time  range)  lidocaine (PF) (XYLOCAINE) 1 % injection 5 mL (5 mLs Infiltration Given by Other 05/19/20 2125)  bupivacaine (MARCAINE) 0.5 % injection 5 mL (5 mLs Infiltration Given by Other 05/19/20 2125)  cephALEXin (KEFLEX) capsule 500 mg (500 mg Oral Given 05/19/20 2237)    ED Course  I have reviewed the triage vital signs and the nursing notes.  Pertinent labs & imaging results that were available during my care of the patient were reviewed by me and considered in my medical decision making (see chart for details).    MDM Rules/Calculators/A&P                         Patient is a 64 year old man who presents today for evaluation of a laceration to his left thumb that occurred about 4 hours prior to arrival.  On exam he is a approximately 2 cm C-shaped laceration on the dorsum of his left thumb.  There is no damage to the nailbed.  His tetanus is up-to-date. X-rays were obtained without evidence of retained foreign body. I discussed at length with patient possibility of retained glass and that this is not always can show up on x-ray.  Wound is irrigated with 1 L saline, the base of the wound was viewed in a blood free field with the assistance of a tourniquet without evidence of retained glass or other foreign body.    Digital block was performed to allow for repair which patient tolerated well.  3 sutures are placed.  Sutured wound care as discussed.  While patient was awake and alert he would be asleep every time I walked into the room and require loud voice to wake up.  Given this I do not think narcotic pain medicine here is in his best interest tonight especially as the digital block should help control pain.  I consulted [place] PMP and he was given a prescription that he can pick up tomorrow for 2 pills of Vicodin with instructions to take them at night for the next 2  nights as needed for pain.  He is given a dose of Keflex while in the emergency room and prescription given for  outpatient follow-up.  Suture removal in 10 to 14 days with PCP, recheck sooner if concerns for infection.  He is neurovascularly intact on my exam.  Return precautions were discussed with patient who states their understanding.  At the time of discharge patient denied any unaddressed complaints or concerns.  Patient is agreeable for discharge home.  Note: Portions of this report may have been transcribed using voice recognition software. Every effort was made to ensure accuracy; however, inadvertent computerized transcription errors may be present  Final Clinical Impression(s) / ED Diagnoses Final diagnoses:  Hypertension, unspecified type  Laceration of left thumb, foreign body presence unspecified, nail damage status unspecified, initial encounter    Rx / DC Orders ED Discharge Orders         Ordered    HYDROcodone-acetaminophen (NORCO/VICODIN) 5-325 MG tablet  At bedtime PRN        05/19/20 2224    cephALEXin (KEFLEX) 500 MG capsule  4 times daily        05/19/20 2226           Ollen Gross 05/19/20 2307    Valarie Merino, MD 05/20/20 2326

## 2020-05-19 NOTE — ED Notes (Signed)
Dressing applied to wound.  

## 2020-05-19 NOTE — ED Triage Notes (Signed)
Left thumb laceration approx 1/2" on glass.

## 2020-05-19 NOTE — Discharge Instructions (Signed)
Please keep your thumb clean and dry.  After the first 48 hours you may get it wet with clean water however do not soak or submerge your thumb while you have the stitches in.  I have given you a prescription for pain medicine.  You may take this at night if needed. You are being prescribed a medication which may make you sleepy. For 24 hours after one dose please do not drive, operate heavy machinery, care for a small child with out another adult present, or perform any activities that may cause harm to you or someone else if you were to fall asleep or be impaired.    Please take Ibuprofen (Advil, motrin) and Tylenol (acetaminophen) to relieve your pain.  You may take up to 600 MG (3 pills) of normal strength ibuprofen every 8 hours as needed.  In between doses of ibuprofen you make take tylenol, up to 1,000 mg (two extra strength pills).  Do not take more than 3,000 mg tylenol in a 24 hour period.  Please check all medication labels as many medications such as pain and cold medications may contain tylenol.  Do not drink alcohol while taking these medications.  Do not take other NSAID'S while taking ibuprofen (such as aleve or naproxen).  Please take ibuprofen with food to decrease stomach upset.  You may have diarrhea from the antibiotics.  It is very important that you continue to take the antibiotics even if you get diarrhea unless a medical professional tells you that you may stop taking them.  If you stop too early the bacteria you are being treated for will become stronger and you may need different, more powerful antibiotics that have more side effects and worsening diarrhea.  Please stay well hydrated and consider probiotics as they may decrease the severity of your diarrhea.   While in the ED your blood pressure was high.  Please follow up with your primary care doctor or the wellness clinic for repeat evaluation as you may need medication.  High blood pressure can cause long term, potentially  serious, damage if left untreated.

## 2021-07-03 ENCOUNTER — Encounter (HOSPITAL_COMMUNITY): Payer: Self-pay | Admitting: Internal Medicine

## 2021-07-03 ENCOUNTER — Emergency Department (HOSPITAL_COMMUNITY): Payer: No Typology Code available for payment source

## 2021-07-03 ENCOUNTER — Other Ambulatory Visit: Payer: Self-pay

## 2021-07-03 ENCOUNTER — Observation Stay (HOSPITAL_COMMUNITY)
Admission: EM | Admit: 2021-07-03 | Discharge: 2021-07-04 | Disposition: A | Discharge status: Home / Self Care | Payer: No Typology Code available for payment source | Attending: Family Medicine | Admitting: Family Medicine

## 2021-07-03 DIAGNOSIS — I10 Essential (primary) hypertension: Secondary | ICD-10-CM | POA: Diagnosis not present

## 2021-07-03 DIAGNOSIS — K7460 Unspecified cirrhosis of liver: Secondary | ICD-10-CM | POA: Diagnosis present

## 2021-07-03 DIAGNOSIS — Z79899 Other long term (current) drug therapy: Secondary | ICD-10-CM | POA: Insufficient documentation

## 2021-07-03 DIAGNOSIS — E876 Hypokalemia: Secondary | ICD-10-CM

## 2021-07-03 DIAGNOSIS — N400 Enlarged prostate without lower urinary tract symptoms: Secondary | ICD-10-CM | POA: Diagnosis present

## 2021-07-03 DIAGNOSIS — R9431 Abnormal electrocardiogram [ECG] [EKG]: Secondary | ICD-10-CM

## 2021-07-03 DIAGNOSIS — Z87891 Personal history of nicotine dependence: Secondary | ICD-10-CM | POA: Insufficient documentation

## 2021-07-03 DIAGNOSIS — Z20822 Contact with and (suspected) exposure to covid-19: Secondary | ICD-10-CM | POA: Insufficient documentation

## 2021-07-03 DIAGNOSIS — T50904A Poisoning by unspecified drugs, medicaments and biological substances, undetermined, initial encounter: Secondary | ICD-10-CM

## 2021-07-03 DIAGNOSIS — D649 Anemia, unspecified: Secondary | ICD-10-CM | POA: Diagnosis present

## 2021-07-03 DIAGNOSIS — F4312 Post-traumatic stress disorder, chronic: Secondary | ICD-10-CM | POA: Diagnosis present

## 2021-07-03 DIAGNOSIS — E1165 Type 2 diabetes mellitus with hyperglycemia: Secondary | ICD-10-CM | POA: Diagnosis present

## 2021-07-03 DIAGNOSIS — F32A Depression, unspecified: Secondary | ICD-10-CM | POA: Diagnosis present

## 2021-07-03 DIAGNOSIS — Z7982 Long term (current) use of aspirin: Secondary | ICD-10-CM | POA: Diagnosis not present

## 2021-07-03 DIAGNOSIS — J9601 Acute respiratory failure with hypoxia: Secondary | ICD-10-CM | POA: Insufficient documentation

## 2021-07-03 DIAGNOSIS — F4321 Adjustment disorder with depressed mood: Secondary | ICD-10-CM

## 2021-07-03 DIAGNOSIS — T405X1A Poisoning by cocaine, accidental (unintentional), initial encounter: Principal | ICD-10-CM | POA: Insufficient documentation

## 2021-07-03 DIAGNOSIS — E785 Hyperlipidemia, unspecified: Secondary | ICD-10-CM | POA: Diagnosis present

## 2021-07-03 DIAGNOSIS — T40601A Poisoning by unspecified narcotics, accidental (unintentional), initial encounter: Secondary | ICD-10-CM | POA: Diagnosis present

## 2021-07-03 DIAGNOSIS — T50901A Poisoning by unspecified drugs, medicaments and biological substances, accidental (unintentional), initial encounter: Secondary | ICD-10-CM

## 2021-07-03 HISTORY — DX: Poisoning by unspecified narcotics, accidental (unintentional), initial encounter: T40.601A

## 2021-07-03 HISTORY — DX: Post-traumatic stress disorder, chronic: F43.12

## 2021-07-03 LAB — COMPREHENSIVE METABOLIC PANEL
ALT: 43 U/L (ref 0–44)
AST: 68 U/L — ABNORMAL HIGH (ref 15–41)
Albumin: 4 g/dL (ref 3.5–5.0)
Alkaline Phosphatase: 55 U/L (ref 38–126)
Anion gap: 6 (ref 5–15)
BUN: 18 mg/dL (ref 8–23)
CO2: 31 mmol/L (ref 22–32)
Calcium: 8.4 mg/dL — ABNORMAL LOW (ref 8.9–10.3)
Chloride: 101 mmol/L (ref 98–111)
Creatinine, Ser: 0.86 mg/dL (ref 0.61–1.24)
GFR, Estimated: >60 mL/min (ref >60)
Glucose, Bld: 177 mg/dL — ABNORMAL HIGH (ref 70–99)
Potassium: 3.8 mmol/L (ref 3.5–5.1)
Sodium: 138 mmol/L (ref 135–145)
Total Bilirubin: 0.8 mg/dL (ref 0.3–1.2)
Total Protein: 6.8 g/dL (ref 6.5–8.1)

## 2021-07-03 LAB — CBC WITH DIFFERENTIAL/PLATELET
Abs Immature Granulocytes: 0.06 10*3/uL (ref 0.00–0.07)
Basophils Absolute: 0.1 10*3/uL (ref 0.0–0.1)
Basophils Relative: 1 %
Eosinophils Absolute: 0.1 10*3/uL (ref 0.0–0.5)
Eosinophils Relative: 1 %
HCT: 37.7 % — ABNORMAL LOW (ref 39.0–52.0)
Hemoglobin: 12.6 g/dL — ABNORMAL LOW (ref 13.0–17.0)
Immature Granulocytes: 1 %
Lymphocytes Relative: 12 %
Lymphs Abs: 1.3 10*3/uL (ref 0.7–4.0)
MCH: 29.3 pg (ref 26.0–34.0)
MCHC: 33.4 g/dL (ref 30.0–36.0)
MCV: 87.7 fL (ref 80.0–100.0)
Monocytes Absolute: 0.8 10*3/uL (ref 0.1–1.0)
Monocytes Relative: 7 %
Neutro Abs: 8.6 10*3/uL — ABNORMAL HIGH (ref 1.7–7.7)
Neutrophils Relative %: 78 %
Platelets: 182 10*3/uL (ref 150–400)
RBC: 4.3 MIL/uL (ref 4.22–5.81)
RDW: 12.4 % (ref 11.5–15.5)
WBC: 11 10*3/uL — ABNORMAL HIGH (ref 4.0–10.5)
nRBC: 0 % (ref 0.0–0.2)

## 2021-07-03 LAB — ACETAMINOPHEN LEVEL: Acetaminophen (Tylenol), Serum: <10 ug/mL — ABNORMAL LOW (ref 10–30)

## 2021-07-03 LAB — RAPID URINE DRUG SCREEN, HOSP PERFORMED
Amphetamines: NOT DETECTED
Barbiturates: NOT DETECTED
Benzodiazepines: POSITIVE — AB
Cocaine: POSITIVE — AB
Opiates: NOT DETECTED
Tetrahydrocannabinol: NOT DETECTED

## 2021-07-03 LAB — CBG MONITORING, ED: Glucose-Capillary: 115 mg/dL — ABNORMAL HIGH (ref 70–99)

## 2021-07-03 LAB — GLUCOSE, CAPILLARY
Glucose-Capillary: 132 mg/dL — ABNORMAL HIGH (ref 70–99)
Glucose-Capillary: 148 mg/dL — ABNORMAL HIGH (ref 70–99)
Glucose-Capillary: 185 mg/dL — ABNORMAL HIGH (ref 70–99)

## 2021-07-03 LAB — MAGNESIUM: Magnesium: 2.1 mg/dL (ref 1.7–2.4)

## 2021-07-03 LAB — RESP PANEL BY RT-PCR (FLU A&B, COVID) ARPGX2
Influenza A by PCR: NEGATIVE
Influenza B by PCR: NEGATIVE
SARS Coronavirus 2 by RT PCR: NEGATIVE

## 2021-07-03 LAB — HEMOGLOBIN A1C
Hgb A1c MFr Bld: 6.4 % — ABNORMAL HIGH (ref 4.8–5.6)
Mean Plasma Glucose: 137 mg/dL

## 2021-07-03 LAB — MRSA NEXT GEN BY PCR, NASAL: MRSA by PCR Next Gen: NOT DETECTED

## 2021-07-03 LAB — ETHANOL: Alcohol, Ethyl (B): <10 mg/dL (ref <10)

## 2021-07-03 LAB — SALICYLATE LEVEL: Salicylate Lvl: <7 mg/dL — ABNORMAL LOW (ref 7.0–30.0)

## 2021-07-03 MED ORDER — ENOXAPARIN SODIUM 40 MG/0.4ML IJ SOSY
40.0000 mg | PREFILLED_SYRINGE | INTRAMUSCULAR | Status: DC
  Administered 2021-07-03 – 2021-07-04 (×2): 40 mg via SUBCUTANEOUS
  Filled 2021-07-03 (×2): qty 0.4

## 2021-07-03 MED ORDER — ACETAMINOPHEN 325 MG PO TABS: 650.0000 mg | ORAL_TABLET | Freq: Four times a day (QID) | ORAL | Status: DC | PRN

## 2021-07-03 MED ORDER — NALOXONE HCL 2 MG/2ML IJ SOSY
1.0000 mg | PREFILLED_SYRINGE | Freq: Once | INTRAMUSCULAR | Status: AC
  Administered 2021-07-03: 05:00:00 1 mg via INTRAVENOUS
  Filled 2021-07-03: qty 2

## 2021-07-03 MED ORDER — ONDANSETRON HCL 4 MG/2ML IJ SOLN: 4.0000 mg | Freq: Four times a day (QID) | INTRAMUSCULAR | Status: DC | PRN

## 2021-07-03 MED ORDER — CLONIDINE HCL 0.1 MG PO TABS
0.1000 mg | ORAL_TABLET | Freq: Four times a day (QID) | ORAL | Status: DC | PRN
  Administered 2021-07-03: 14:00:00 0.1 mg via ORAL
  Filled 2021-07-03: qty 1

## 2021-07-03 MED ORDER — ACETAMINOPHEN 650 MG RE SUPP: 650.0000 mg | Freq: Four times a day (QID) | RECTAL | Status: DC | PRN

## 2021-07-03 MED ORDER — AMLODIPINE BESYLATE 10 MG PO TABS
10.0000 mg | ORAL_TABLET | Freq: Every day | ORAL | Status: DC
  Administered 2021-07-03 – 2021-07-04 (×2): 10 mg via ORAL
  Filled 2021-07-03: qty 2
  Filled 2021-07-03: qty 1

## 2021-07-03 MED ORDER — METOPROLOL TARTRATE 5 MG/5ML IV SOLN
5.0000 mg | Freq: Four times a day (QID) | INTRAVENOUS | Status: DC | PRN
  Administered 2021-07-03: 19:00:00 5 mg via INTRAVENOUS
  Filled 2021-07-03: qty 5

## 2021-07-03 MED ORDER — INSULIN ASPART 100 UNIT/ML IJ SOLN
0.0000 [IU] | Freq: Three times a day (TID) | INTRAMUSCULAR | Status: DC
  Administered 2021-07-03 (×2): 1 [IU] via SUBCUTANEOUS
  Filled 2021-07-03: qty 0.09

## 2021-07-03 MED ORDER — TERAZOSIN HCL 2 MG PO CAPS
4.0000 mg | ORAL_CAPSULE | Freq: Every day | ORAL | Status: DC
  Administered 2021-07-03: 21:00:00 4 mg via ORAL
  Filled 2021-07-03: qty 2

## 2021-07-03 MED ORDER — SERTRALINE HCL 100 MG PO TABS
100.0000 mg | ORAL_TABLET | Freq: Every day | ORAL | Status: DC
  Administered 2021-07-04: 100 mg via ORAL
  Filled 2021-07-03: qty 1

## 2021-07-03 MED ORDER — TRAZODONE HCL 50 MG PO TABS: 50.0000 mg | ORAL_TABLET | Freq: Every evening | ORAL | Status: DC | PRN

## 2021-07-03 MED ORDER — NALOXONE HCL 4 MG/10ML IJ SOLN
1.0000 mg/h | INTRAVENOUS | Status: DC
  Administered 2021-07-03 (×3): 1 mg/h via INTRAVENOUS
  Filled 2021-07-03 (×6): qty 10

## 2021-07-03 MED ORDER — HYDROCHLOROTHIAZIDE 25 MG PO TABS
25.0000 mg | ORAL_TABLET | Freq: Every day | ORAL | Status: DC
  Administered 2021-07-03 – 2021-07-04 (×2): 25 mg via ORAL
  Filled 2021-07-03 (×2): qty 1

## 2021-07-03 MED ORDER — CHLORHEXIDINE GLUCONATE CLOTH 2 % EX PADS
6.0000 | MEDICATED_PAD | Freq: Every day | CUTANEOUS | Status: DC
  Administered 2021-07-03: 10:00:00 6 via TOPICAL

## 2021-07-03 MED ORDER — ONDANSETRON HCL 4 MG PO TABS: 4.0000 mg | ORAL_TABLET | Freq: Four times a day (QID) | ORAL | Status: DC | PRN

## 2021-07-03 MED ORDER — ALBUTEROL SULFATE (2.5 MG/3ML) 0.083% IN NEBU: 2.5000 mg | INHALATION_SOLUTION | RESPIRATORY_TRACT | Status: DC | PRN

## 2021-07-03 NOTE — H&P (Addendum)
History and Physical    Sean Shannon:[id number] DOB: [birth year] DOA: 07/03/2021  PCP: Charlyne Petrin, MD  Patient coming from: Home.  I have personally briefly reviewed patient's old medical records in [place]  Chief Complaint:  AMS.  HPI: Sean Shannon is a 66 y.o. male with medical history significant of anxiety, depression, chronic PTSD, polysubstance abuse, history of gonorrhea, headache, hepatitis C, hyperlipidemia, hypertension who recently lost his wife a few weeks ago and recently buried her on Friday.  He ingested some cocaine around 3 in the morning, which apparently was laced with an unknown narcotic after the patient lost consciousness.  His friend stated that they heard a loud noise after he fell in the kitchen.  His friend went to help him, found him diaphoretic, called EMS and gave him CPR.  When EMS crew arrived, they found him to be with pinpoint pupils, hypopneic and hypoxic.  They gave him Narcan and he responded.  However, the patient began behaving erratically and was given 2.5 mg of midazolam.  After he arrived to the emergency department.  He was given more naloxone and placed on a naloxone infusion.  He does not remember much of what happened.  He denied any suicidal or homicidal thoughts.  However, he feels that lost both of his significant other recently.  No fever, headache, rhinorrhea, sore throat, sore throat, wheezing, hemoptysis or dyspnea.  No chest pain, palpitations, PND, orthopnea or pitting edema of the lower extremities.  No abdominal pain, nausea, emesis, diarrhea, constipation, melena hematochezia.  No dysuria, frequency or hematuria.  No polyuria, polydipsia, polyphagia or blurred vision.  Denies suicidal and homicidal intentions.  ED Course: Initial vital signs are temperature 97.8 F, pulse 88, respiration 18, BP 142/89 mmHg O2 sat 95% on room air.  The patient was given 1 mg of naloxone IVP in the emergency department followed by naloxone  infusion.  Lab work: CBC is her white count 11.0, hemoglobin 12.6 g/dL platelets 182.  Unremarkable alcohol, acetaminophen and salicylate levels.  UDS was positive for cocaine and benzodiazepines.  BMP showed glucose of 177, calcium of 8.4 mg/dL and AST of 68 units/L.  The rest of the CMP values were unremarkable.  Imaging: One-view portable chest radiograph did not show any active cardiopulmonary disease.  Review of Systems: As per HPI otherwise all other systems reviewed and are negative.  Past Medical History:  Diagnosis Date   Anxiety    Borderline diabetes    BPH (benign prostatic hyperplasia) 08/02/2009   Qualifier: Diagnosis of  By: Dyann Kief MD, Carlos     Chest pain    Chronic post-traumatic stress disorder 07/03/2021   Depression    Gonorrhea    remote h/o gonorrhea which was treated as per patient   Headache(784.0)    Hepatitis    Hepatitis C- took Harvani medication   Hyperlipidemia    Hypertension    Narcotic overdose ([place]) 07/03/2021    Past Surgical History:  Procedure Laterality Date   EUS N/A 04/03/2016   Procedure: UPPER ENDOSCOPIC ULTRASOUND (EUS) RADIAL;  Surgeon: Milus Banister, MD;  Location: [facility] END[facility];  Service: Endoscopy;  Laterality: N/A;   SPINAL FIXATION SURGERY     TONSILECTOMY, ADENOIDECTOMY, BILATERAL MYRINGOTOMY AND TUBES  1959   TONSILLECTOMY      Social History  reports that he quit smoking about 28 years ago. His smoking use included cigarettes. He has never used smokeless tobacco. He reports current drug use. Drugs: Cocaine and Heroin.  He reports that he does not drink alcohol.  No Active Allergies  Family History  Problem Relation Age of Onset   Diabetes Other        Family history   Prior to Admission medications   Medication Sig Start Date End Date Taking? Authorizing Provider  terazosin (HYTRIN) 2 MG capsule Take 4 mg by mouth at bedtime.    Yes [provider]  traZODone (DESYREL) 50 MG tablet Take 50 mg by mouth at  bedtime as needed for sleep. 05/02/21  Yes [provider]  amLODipine (NORVASC) 10 MG tablet Take 10 mg by mouth daily after breakfast.    [provider]  aspirin 325 MG EC tablet Take 325 mg by mouth every 6 (six) hours as needed for pain.    [provider]  Carboxymethylcellulose Sodium (THERATEARS OP) Place 1 drop into both eyes daily as needed (dry eyes).    [provider]  Cholecalciferol (VITAMIN D) 2000 units CAPS Take 2,000 Units by mouth daily after breakfast.    [provider]  hydrochlorothiazide (HYDRODIURIL) 25 MG tablet Take 1 tablet (25 mg total) by mouth daily. 09/02/12   Trish Fountain, MD  HYDROcodone-acetaminophen (NORCO/VICODIN) 5-325 MG tablet Take 1 tablet by mouth at bedtime as needed for moderate pain or severe pain. 05/19/20   [name], PA-C  sertraline (ZOLOFT) 100 MG tablet Take 100 mg by mouth daily after breakfast.    [provider]    Physical Exam: Vitals:   07/03/21 0645 07/03/21 0700 07/03/21 0724 07/03/21 0814  BP: (!) 158/102 (!) 162/95 (!) 152/94 (!) 169/110  Pulse: 79 89 77 79  Resp: 15 (!) 27 14 19   Temp:      TempSrc:      SpO2: 98% 100% 100% 100%    Constitutional: NAD, calm, comfortable. Eyes: PERRL, lids and conjunctivae normal.  Mild bilateral conjunctival injection. ENMT: Mucous membranes are moist. Posterior pharynx clear of any exudate or lesions.  Mostly absent dentition. Neck: normal, supple, no masses, no thyromegaly Respiratory: clear to auscultation bilaterally, no wheezing, no crackles. Normal respiratory effort. No accessory muscle use.  Cardiovascular: Regular rate and rhythm, no murmurs / rubs / gallops. No extremity edema. 2+ pedal pulses. No carotid bruits.  Abdomen: No distention.  Soft, no tenderness, no masses palpated. No hepatosplenomegaly. Bowel sounds positive.  Musculoskeletal: no clubbing / cyanosis.  Mild generalized weakness.  Good ROM, no  contractures. Normal muscle tone.  Skin: no acute rashes, lesions, ulcers on limited dermatological examination. Neurologic: Unintentional facial movements.  CN 2-12 grossly intact. Sensation intact, DTR normal. Strength 5/5 in all 4.  Psychiatric: Normal judgment and insight. Alert and oriented x 3. Normal mood.   Labs on Admission: I have personally reviewed following labs and imaging studies  CBC: Recent Labs  Lab 07/03/21 0431  WBC 11.0*  NEUTROABS 8.6*  HGB 12.6*  HCT 37.7*  MCV 87.7  PLT 448    Basic Metabolic Panel: Recent Labs  Lab 07/03/21 0431  NA 138  K 3.8  CL 101  CO2 31  GLUCOSE 177*  BUN 18  CREATININE 0.86  CALCIUM 8.4*    GFR: CrCl cannot be calculated (Unknown ideal weight.).  Liver Function Tests: Recent Labs  Lab 07/03/21 0431  AST 68*  ALT 43  ALKPHOS 55  BILITOT 0.8  PROT 6.8  ALBUMIN 4.0   Radiological Exams on Admission: DG Chest Portable 1 View  Result Date: 07/03/2021 CLINICAL DATA:  Hypoxia, possible  aspiration/overdose. EXAM: PORTABLE CHEST 1 VIEW COMPARISON:  Portable chest 05/11/2017. FINDINGS: Heart size and vasculature are normal with stable mediastinal configuration. Mild aortic atherosclerosis. The lungs are clear. No pleural collection is seen. Osteopenia. IMPRESSION: No active disease. Electronically Signed   By: Telford Nab M.D.   On: 07/03/2021 06:06    EKG: Independently reviewed.  Vent. rate 82 BPM PR interval 178 ms QRS duration 105 ms QT/QTcB 414/484 ms P-R-T axes 93 16 77 Sinus rhythm Probable left atrial enlargement Minimal ST elevation, anterior leads Borderline prolonged QT interval  Assessment/Plan Principal Problem:   Narcotic overdose (HCC) Observation/ICU. Continue naloxone infusion. Taper off infusion indicated. Consult TOC team.  Active Problems:   Chronic post-traumatic stress disorder   Depression Continue sertraline 100 mg p.o. daily.    Hyperlipidemia Currently not on any  medication. Continue lifestyle modifications. Follow-up with PCP.    Essential hypertension Continue amlodipine 10 mg p.o. daily. Continue terazosin 4 mg p.o. at bedtime.    BPH (benign prostatic hyperplasia) On terazosin. Follow-up with PCP and/or urology.    Type 2 diabetes mellitus with hyperglycemia (HCC) Carbohydrate modified diet. CBG monitoring with RI SS while in the hospital. Oneal Grout and A1c.    Normocytic anemia Monitor H&H.     Cirrhosis of liver (HCC) No signs of decompensation. Monitor LFTs. Follow-up with PCP as an outpatient.   DVT prophylaxis: Lovenox SQ. Code Status:   Full code. Family Communication:   Disposition Plan:   Patient is from:  Home.  Anticipated DC to:  Home.  Anticipated DC date:  07/04/2021.  Anticipated DC barriers: Clinical status.  Consults called:  TOC team. Admission status:  Observation/ICU.  Severity of Illness: High severity in the setting of narcotic and cocaine overdose requiring a naloxone infusion.  Reubin Milan MD [facility]  How to contact the [facility] Attending or Consulting provider [place] or covering provider during after hours [place], for this patient?   Check the care team in [facility] and look for a) attending/consulting TR[facility] provider listed and b) the [facility] team listed Log into [email or web address] and use [facility]'s universal password to access. If you do not have the password, please contact the hospital operator. Locate the [facility] provider you are looking for under [facility] and page to a number that you can be directly reached. If you still have difficulty reaching the provider, please page the [facility] (Director on Call) for the Hospitalists listed on amion for assistance.  07/03/2021, 8:39 AM   This document was prepared in Dragon voice recognition software may contain some unintended transcription errors.

## 2021-07-03 NOTE — Progress Notes (Signed)
Pt requesting to be disconnected from IV and pulse ox monitoring. Pt alert and oriented. 99% on RA. MD Olevia Bowens notified. Verbal order to stop narcan infusion, and continue to monitor.
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
## 2021-07-03 NOTE — Progress Notes (Signed)
Pt's nephew, Geralyn Corwin, added to pt contacts per pt request.

## 2021-07-03 NOTE — ED Notes (Signed)
Pt having periods of apnea and O2 dipped to 70s. Informed provider and received orders for 1mg  narcan. Pt placed on 2L [place]

## 2021-07-03 NOTE — ED Provider Notes (Addendum)
[facility] DEPT Provider Note   CSN: [id number] Arrival date & time: 07/03/21  0418     History  Overdose   JUBAL RADEMAKER is a 66 y.o. male here for evaluation of overdose.  Apparently found by fire on side of the road had pinpoint pupils, slowed respirations and hypoxic.  Fire gave Narcan improved mentation, on EMS arrival was given additional IV Narcan with improvement in mentation and respiratory status.  Apparently after Narcan then started having some erratic movement to his extremities and subsequently given Versed.  Patient endorses cocaine usage however states that he must been "laced" with something else.  States significant other died few weeks ago who recently buried her on Friday.  He denies any complaints> no fever, headache, chest pain, shortness of breath, abdominal pain, nausea. HPI     Home Medications Prior to Admission medications   Medication Sig Start Date End Date Taking? Authorizing Provider  terazosin (HYTRIN) 2 MG capsule Take 4 mg by mouth at bedtime.    Yes [provider]  traZODone (DESYREL) 50 MG tablet Take 50 mg by mouth at bedtime as needed for sleep. 05/02/21  Yes [provider]  amLODipine (NORVASC) 10 MG tablet Take 10 mg by mouth daily after breakfast.    [provider]  aspirin 325 MG EC tablet Take 325 mg by mouth every 6 (six) hours as needed for pain.    [provider]  Carboxymethylcellulose Sodium (THERATEARS OP) Place 1 drop into both eyes daily as needed (dry eyes).    [provider]  Cholecalciferol (VITAMIN D) 2000 units CAPS Take 2,000 Units by mouth daily after breakfast.    [provider]  hydrochlorothiazide (HYDRODIURIL) 25 MG tablet Take 1 tablet (25 mg total) by mouth daily. 09/02/12   Trish Fountain, MD  HYDROcodone-acetaminophen (NORCO/VICODIN) 5-325 MG tablet Take 1 tablet by mouth at bedtime as needed for moderate pain or severe pain.  05/19/20   [name], PA-C  sertraline (ZOLOFT) 100 MG tablet Take 100 mg by mouth daily after breakfast.    [provider]      Allergies    Patient has no active allergies.    Review of Systems   Review of Systems  Constitutional: Negative.   HENT: Negative.    Respiratory: Negative.    Cardiovascular: Negative.   Gastrointestinal: Negative.   Genitourinary: Negative.   Musculoskeletal: Negative.   Skin: Negative.   Neurological: Negative.   All other systems reviewed and are negative.  Physical Exam Updated Vital Signs BP (!) 162/95    Pulse 89    Temp 97.8 F (36.6 C) (Oral)    Resp (!) 27    SpO2 100%  Physical Exam Vitals and nursing note reviewed.  Constitutional:      General: He is not in acute distress.    Appearance: He is well-developed. He is not ill-appearing, toxic-appearing or diaphoretic.  HENT:     Head: Normocephalic and atraumatic.  Eyes:     Pupils: Pupils are equal, round, and reactive to light.     Comments: Pupils 2 mm Bl  Cardiovascular:     Rate and Rhythm: Normal rate and regular rhythm.     Pulses: Normal pulses.     Heart sounds: Normal heart sounds.  Pulmonary:     Effort: Pulmonary effort is normal. No respiratory distress.     Breath sounds: Normal breath sounds.  Abdominal:     General: Bowel sounds  are normal. There is no distension.     Palpations: Abdomen is soft.  Musculoskeletal:        General: Normal range of motion.     Cervical back: Normal range of motion and neck supple.     Comments: Moves all 4 extremities without difficulty  Skin:    General: Skin is warm and dry.  Neurological:     General: No focal deficit present.     Mental Status: He is alert and oriented to person, place, and time.     Comments: Follows commands    ED Results / Procedures / Treatments   Labs (all labs ordered are listed, but only abnormal results are displayed) Labs Reviewed  COMPREHENSIVE METABOLIC PANEL - Abnormal;  Notable for the following components:      Result Value   Glucose, Bld 177 (*)    Calcium 8.4 (*)    AST 68 (*)    All other components within normal limits  CBC WITH DIFFERENTIAL/PLATELET - Abnormal; Notable for the following components:   WBC 11.0 (*)    Hemoglobin 12.6 (*)    HCT 37.7 (*)    Neutro Abs 8.6 (*)    All other components within normal limits  ACETAMINOPHEN LEVEL - Abnormal; Notable for the following components:   Acetaminophen (Tylenol), Serum <10 (*)    All other components within normal limits  SALICYLATE LEVEL - Abnormal; Notable for the following components:   Salicylate Lvl <7.2 (*)    All other components within normal limits  ETHANOL  RAPID URINE DRUG SCREEN, HOSP PERFORMED    EKG None  Radiology DG Chest Portable 1 View  Result Date: 07/03/2021 CLINICAL DATA:  Hypoxia, possible aspiration/overdose. EXAM: PORTABLE CHEST 1 VIEW COMPARISON:  Portable chest 05/11/2017. FINDINGS: Heart size and vasculature are normal with stable mediastinal configuration. Mild aortic atherosclerosis. The lungs are clear. No pleural collection is seen. Osteopenia. IMPRESSION: No active disease. Electronically Signed   By: Telford Nab M.D.   On: 07/03/2021 06:06    Procedures .Critical Care Performed by: Nettie Elm, PA-C Authorized by: Nettie Elm, PA-C   Critical care provider statement:    Critical care time (minutes):  31   Critical care time was exclusive of:  Separately billable procedures and treating other patients and teaching time   Critical care was necessary to treat or prevent imminent or life-threatening deterioration of the following conditions:  Toxidrome (Overdose requiring narcan)    Medications Ordered in ED Medications  naloxone HCl (NARCAN) 4 mg in dextrose 5 % 250 mL infusion (has no administration in time range)  naloxone [facility]) injection 1 mg (1 mg Intravenous Given 07/03/21 0444)    ED Course/ Medical Decision Making/ A&P     66 year old here for evaluation of intentional overdose after using cocaine.  Needed Narcan x2 with EMS prior to arrival.  On my initial evaluation he is not hypoxic.  Denies any intent at self-harm however does admit to depression after recently bearing his wife.  We will plan on close observation, labs and reassess  0435: Called to bedside by nursing.  Patient having intermittent episodes of apnea.  Oxygen does go up when you stimulate patient to arouse.  Placed on nasal cannula, given additional dose of Narcan here in ED  Labs and imaging personally reviewed and interpreted:  CBC leukocytosis 09.4 Metabolic panel mild hyperglycemia 709 Ethanol, salicylate, acetaminophen within normal limits EKG without ischemic changes DG chest without acute abnormality  0530: Patient  reassessed after Narcan.  Sleepy however protecting airway, no periods of apnea.  We will continue to monitor. 2 L [place] currently  0625: Reassessed.  Will need to wean off supplemental oxygen, continue to observe.  0700: Per nursing patient still becoming apneic. Will place on narcan drip given 3x narcan with persistent intermittent apnea and hypoxia.   Will needs admission   Oncoming provider to discus with admitting team                            Medical Decision Making Amount and/or Complexity of Data Reviewed Labs: ordered. Decision-making details documented in ED Course. Radiology: ordered and independent interpretation performed. Decision-making details documented in ED Course. ECG/medicine tests: ordered and independent interpretation performed. Decision-making details documented in ED Course.  Risk OTC drugs. Prescription drug management. Parenteral controlled substances. Diagnosis or treatment significantly limited by social determinants of health. Risk Details: Substance use   Final Clinical Impression(s) / ED Diagnoses Final diagnoses:  Overdose of undetermined intent, initial encounter  Acute  respiratory failure with hypoxia [facility])    Rx / DC Orders ED Discharge Orders     None          Brodee Mauritz A, PA-C 07/03/21 0710    Maudie Flakes, MD 07/03/21 [phone]

## 2021-07-03 NOTE — ED Provider Notes (Signed)
Care assumed from [facility], PA-C at shift change, please see their note for full detail, but in brief Sean Shannon is a 66 y.o. male presents with drug overdose.  Plan: On 2L, doing better , observe for a couple more hours, SI? Physical Exam  BP (!) 161/96    Pulse 83    Temp 97.8 F (36.6 C) (Oral)    Resp (!) 25    SpO2 100%   Physical Exam  Procedures  Procedures  ED Course / MDM    Medical Decision Making Amount and/or Complexity of Data Reviewed Labs: ordered. Radiology: ordered.  Risk Prescription drug management. Decision regarding hospitalization.  Patient on 2 L nasal cannula and initially seem to be improving after 3 doses of Narcan However, RN reports patient continues becoming apneic.  Patient placed on Narcan drip and will require admission with possible stepdown.  I spoke with Dr. Olevia Bowens who agrees to admit patient.       Sean Shannon, Sean Shannon 07/03/21 0755    Maudie Flakes, MD 07/09/21 [phone]

## 2021-07-03 NOTE — Progress Notes (Addendum)
[facility] admitting physician addendum:  The staff reports that the patient's BP is 176/84 mmHg. Clonidine 0.1 mg q 6hr as needed for SBP >159 mmHg added.  Tennis Must, MD.  Addendum Vernelle Emerald:  Clonidine has not been very effective. We will discontinue it and switch to metoprolol 5 mg IV as needed q 6hr. The patient was feeling restless so the naloxone infusion has been held.

## 2021-07-03 NOTE — ED Notes (Addendum)
MD at bedside. 

## 2021-07-04 DIAGNOSIS — T40601A Poisoning by unspecified narcotics, accidental (unintentional), initial encounter: Secondary | ICD-10-CM | POA: Diagnosis not present

## 2021-07-04 DIAGNOSIS — T50901A Poisoning by unspecified drugs, medicaments and biological substances, accidental (unintentional), initial encounter: Secondary | ICD-10-CM

## 2021-07-04 DIAGNOSIS — E876 Hypokalemia: Secondary | ICD-10-CM

## 2021-07-04 DIAGNOSIS — F4321 Adjustment disorder with depressed mood: Secondary | ICD-10-CM

## 2021-07-04 LAB — CBC
HCT: 38.6 % — ABNORMAL LOW (ref 39.0–52.0)
Hemoglobin: 13.3 g/dL (ref 13.0–17.0)
MCH: 29.4 pg (ref 26.0–34.0)
MCHC: 34.5 g/dL (ref 30.0–36.0)
MCV: 85.4 fL (ref 80.0–100.0)
Platelets: 204 10*3/uL (ref 150–400)
RBC: 4.52 MIL/uL (ref 4.22–5.81)
RDW: 12.3 % (ref 11.5–15.5)
WBC: 10.2 10*3/uL (ref 4.0–10.5)
nRBC: 0 % (ref 0.0–0.2)

## 2021-07-04 LAB — COMPREHENSIVE METABOLIC PANEL
ALT: 32 U/L (ref 0–44)
AST: 36 U/L (ref 15–41)
Albumin: 3.6 g/dL (ref 3.5–5.0)
Alkaline Phosphatase: 60 U/L (ref 38–126)
Anion gap: 6 (ref 5–15)
BUN: 15 mg/dL (ref 8–23)
CO2: 31 mmol/L (ref 22–32)
Calcium: 8.4 mg/dL — ABNORMAL LOW (ref 8.9–10.3)
Chloride: 98 mmol/L (ref 98–111)
Creatinine, Ser: 0.79 mg/dL (ref 0.61–1.24)
GFR, Estimated: >60 mL/min (ref >60)
Glucose, Bld: 139 mg/dL — ABNORMAL HIGH (ref 70–99)
Potassium: 3 mmol/L — ABNORMAL LOW (ref 3.5–5.1)
Sodium: 135 mmol/L (ref 135–145)
Total Bilirubin: 0.6 mg/dL (ref 0.3–1.2)
Total Protein: 6.3 g/dL — ABNORMAL LOW (ref 6.5–8.1)

## 2021-07-04 LAB — GLUCOSE, CAPILLARY: Glucose-Capillary: 110 mg/dL — ABNORMAL HIGH (ref 70–99)

## 2021-07-04 LAB — HIV ANTIBODY (ROUTINE TESTING W REFLEX): HIV Screen 4th Generation wRfx: NONREACTIVE

## 2021-07-04 MED ORDER — POTASSIUM CHLORIDE CRYS ER 20 MEQ PO TBCR
40.0000 meq | EXTENDED_RELEASE_TABLET | Freq: Two times a day (BID) | ORAL | Status: DC
Start: 2021-07-04 — End: 2021-07-04
  Administered 2021-07-04: 40 meq via ORAL
  Filled 2021-07-04: qty 2

## 2021-07-04 MED ORDER — TERAZOSIN HCL 2 MG PO CAPS: 4.0000 mg | ORAL_CAPSULE | Freq: Every day | ORAL | 0 refills | Status: AC

## 2021-07-04 MED ORDER — NALOXONE HCL 4 MG/0.1ML NA LIQD: 1.0000 | NASAL | 1 refills | Status: AC | PRN

## 2021-07-04 NOTE — Assessment & Plan Note (Signed)
Terazosin refilled per his request

## 2021-07-04 NOTE — Assessment & Plan Note (Addendum)
Has consistently denied SI or attempts to harm self in my review of his history and my discussion with him Admits to cocaine use, but suspects something else was in cocaine He was admitted for narcan infusion, has been off this since ~1800 on 07/03/21 Encouraged continued cessation  Encouraged follow up with PCP for resources

## 2021-07-04 NOTE — Discharge Instructions (Signed)
Outpatient Substance Use Treatment Services   [facility] Outpatient Program [address] [place], [place] [address]  [phone] Private insurance, Medicare A&B, and GCCN   [facility]  [address]  [place], [place] [address] [phone] Medicaid, Self Pay   [facility]      [address]  [place], [place] [phone] Medicaid and Private Insurance, Self Pay   [facility] [address]  [place], [place]  [phone] Private Insurance, and Self Pay  [facility]      [address]    [place], [place] [address]  [phone] or [phone] Private Insurance Only   [facility] [address]  [place], [place] [address] [phone] Medicaid, Medicare, Private Insurance  [facility] [address]  [place], [place] [address] [phone] Services are free or reduced  [facility]  [address] [phone]  Self Pay only, sliding scale  [facility]  [address]  [place], [place] [address] [phone] ([facility]) Self Pay, Medicaid Only   [facility] [address]  [place], [place] [address] [phone] Medicaid, Medicare, Private Insurance  Residential Substance Use Treatment Services   [facility]  [address]  [place], [place] [address]  [phone] or [phone] Detox (Medicare, Medicaid, private insurance, and self pay)  Residential Rehab 14 days (Medicare, Medicaid, private insurance, and self pay)   [facility]  [address] [place], [place]  [phone]  Male and Male Detox (Self Pay and Medicaid limited availability)  Rehab only Male (Medicaid and self pay only)   [facility]      [address]   [place], [place] [address]  [phone] or [phone] Detox and Residential Treatment Private Insurance Only   [facility]  [address]  [place], [place] [address]  [phone]  Treatment Only, must make assessment appointment, and must be sober for assessment appointment.  Self Pay Only, Medicare A&B, [place] Medicaid, [place] ID only! *Transportation assistance offered from [employer] [address]  [facility]     [address] [place], [place] [address] Walk in interviews M-Sat 8-4p No pending legal charges [phone]  [facility]:  [facility]  [address] [place], [place] [phone] (Self Pay, Medicaid)  [facility] [address] [place], [place] [address] [phone] Detox and Residential Treatment Medicare and Private Insurance  [facility] [address]  [place], [place] [address] 28 Day Women's Facility: [phone] 28 Day Men's Facility: [phone] Long-term Residential Program:  [phone] Males 25 and Over (No Insurance, upfront fee)  [facility]  [address] [place], [place] [address] [phone] Private Insurance with [employer], Private Pay  [facility] [address] [place], [place] [address] Local [phone] Private Insurance Only  [facility] [address]  [place], [place] [address]  [phone] (Males, upfront fee)  [facility] [place] [address]  [place], [address] [phone] Private Insurance   [facility] Locations  [facility]  [address]  [place], [place]  [phone] Christian Based Program for individuals experiencing homelessness Self Pay, No insurance  [facility]  Men's program: [facility] [address]  [place], [place] [address] [phone]  [facility] Women[facility]: [facility] [address]   [place], [place] [address] [phone] Christian Based Program for individuals  experiencing homelessness Self Pay, No insurance  [facility] [address]  [place], [place] [address]  [phone] Christian Based Program for individuals experiencing homelessness Self Pay, No insurance  [facility] [address]  [place], [place] [address] [phone] Christian Based Program for individuals experiencing homelessness Self Pay, No insurance  [facility] [address] [place],  [phone] Christian Based Program for males experiencing homelessness Self Pay, No insurance                    

## 2021-07-04 NOTE — TOC Progression Note (Addendum)
Transition of Care [facility]) - Progression Note    Patient Details  Name: Sean Shannon MRN: [id number] Date of Birth: 09/26/1955  Transition of [facility]) CM/SW Contact  Purcell Mouton, RN Phone Number: 07/04/2021, 9:41 AM  Clinical Narrative:     Transition of Care (TOC) Screening Note   Patient Details  Name: Sean Shannon Date of Birth: 1955/10/25   [facility] of Care [facility]) CM/SW Contact:    Purcell Mouton, RN Phone Number: 07/04/2021, 9:41 AM    Transition of Care Department [facility]) has reviewed patient and no TOC needs have been identified at this time. We will continue to monitor patient advancement through interdisciplinary progression rounds. If new patient transition needs arise, please place a TOC consult.         Expected Discharge Plan and Services           Expected Discharge Date: 07/04/21                                     Social Determinants of Health (SDOH) Interventions    Readmission Risk Interventions No flowsheet data found.

## 2021-07-04 NOTE — Assessment & Plan Note (Signed)
Wife passed away Sean Shannon few weeks ago Recently buried her this past Friday Encouraged him to follow up with PCP, follow up with therapist/counselor to help process He's not interested in speaking to psychiatry at this time

## 2021-07-04 NOTE — Assessment & Plan Note (Signed)
Amlodipine, HCTZ

## 2021-07-04 NOTE — TOC Progression Note (Addendum)
Transition of Care [facility]) - Progression Note    Patient Details  Name: Sean Shannon MRN: [id number] Date of Birth: 1956-01-11  Transition of [facility]) CM/SW Contact  Purcell Mouton, RN Phone Number: 07/04/2021, 9:58 AM  Clinical Narrative:     Spoke with pt concerning substance abuse[facility]. Pt agreed that he would call to make an appointment.  Pt is aware of Substance Abuse information added to AVS.        Expected Discharge Plan and Services           Expected Discharge Date: 07/04/21                                     Social Determinants of Health (SDOH) Interventions    Readmission Risk Interventions No flowsheet data found.

## 2021-07-04 NOTE — Assessment & Plan Note (Signed)
Continue zoloft Denies SI/HI

## 2021-07-04 NOTE — Assessment & Plan Note (Signed)
Replace, follow outpatient

## 2021-07-04 NOTE — Assessment & Plan Note (Signed)
Follow outpatient  

## 2021-07-04 NOTE — Hospital Course (Addendum)
Sean Shannon is Sean Shannon 66 y.o. male with medical history significant of anxiety, depression, chronic PTSD, polysubstance abuse, hepatitis C, hypertension and multiple other medical problems who recently lost his wife [name] few weeks ago and recently buried her on Friday.  He used cocaine prior to admission and loss consciousness.  His friend stated that they heard [name] loud noise after he fell in the kitchen.  His friend went to help him, found him diaphoretic, called EMS and gave him CPR.  When EMS crew arrived, they found him to be with pinpoint pupils, hypopneic and hypoxic (ED triage notes say they found him with O2 sat of 68%, started bagging and gave narcan).  However, the patient began behaving erratically after narcan and was given 2.5 mg of midazolam.  After he arrived to the emergency department.  He was given more naloxone and placed on [name] naloxone infusion.  He does not remember much of what happened.  He denied any suicidal or homicidal thoughts.  He was admitted and observed.  Narcan infusion stopped last night around 1800.  He's improved today.  Stable for discharge.  See below for additional details.

## 2021-07-04 NOTE — Progress Notes (Signed)
Reviewed AVS with patient. Patients nephew has been called and will be here to give patient a ride home.   Patient did not have any further questions at this time.

## 2021-07-04 NOTE — Discharge Summary (Signed)
Physician Discharge Summary  Sean Shannon XLK:[id number] DOB: 23-Jul-1955 DOA: 07/03/2021  PCP: Charlyne Petrin, MD  Admit date: 07/03/2021 Discharge date: 07/04/2021  Time spent: 40 minutes  Recommendations for Outpatient Follow-up:  Follow outpatient CBC/CMP Follow mood outpatient with recent passing of wife  Consider follow up with psychiatry or counselor/therapist Encourage cessation of drugs   Discharge Diagnoses:  Principal Problem:   Narcotic overdose ([place]) Active Problems:   Overdose, accidental or unintentional, initial encounter   Grief   Chronic post-traumatic stress disorder   Depression   Essential hypertension   Hypokalemia   Hyperlipidemia   BPH (benign prostatic hyperplasia)   Cirrhosis of liver ([place])   Type 2 diabetes mellitus with hyperglycemia ([place])   Normocytic anemia   Prolonged QT interval   Discharge Condition: stable  Diet recommendation: heart healthy  Filed Weights   07/03/21 0911 07/03/21 0953  Weight: 68 kg 62.4 kg    History of present illness:  Sean Shannon is Sean Shannon 66 y.o. male with medical history significant of anxiety, depression, chronic PTSD, polysubstance abuse, hepatitis C, hypertension and multiple other medical problems who recently lost his wife Sean Shannon few weeks ago and recently buried her on Friday.  He used cocaine prior to admission and loss consciousness.  His friend stated that they heard Sean Shannon loud noise after he fell in the kitchen.  His friend went to help him, found him diaphoretic, called EMS and gave him CPR.  When EMS crew arrived, they found him to be with pinpoint pupils, hypopneic and hypoxic (ED triage notes say they found him with O2 sat of 68%, started bagging and gave narcan).  However, the patient began behaving erratically after narcan and was given 2.5 mg of midazolam.  After he arrived to the emergency department.  He was given more naloxone and placed on Sean Shannon naloxone infusion.  He does not remember much of what happened.   He denied any suicidal or homicidal thoughts.  He was admitted and observed.  Narcan infusion stopped last night around 1800.  He's improved today.  Stable for discharge.  See below for additional details.  Hospital Course:  Overdose, accidental or unintentional, initial encounter Has consistently denied SI or attempts to harm self in my review of his history and my discussion with him Admits to cocaine use, but suspects something else was in cocaine He was admitted for narcan infusion, has been off this since ~1800 on 07/03/21 Encouraged continued cessation  Encouraged follow up with PCP for resources  Grief Wife passed away Sean Shannon few weeks ago Recently buried her this past Friday Encouraged him to follow up with PCP, follow up with therapist/counselor to help process He's not interested in speaking to psychiatry at this time  Depression- (present on admission) Continue zoloft Denies SI/HI  Hypokalemia Replace, follow outpatient  Essential hypertension- (present on admission) Amlodipine, HCTZ  BPH (benign prostatic hyperplasia)- (present on admission) Terazosin refilled per his request  Cirrhosis of liver ([name])- (present on admission) Follow outpatient   Procedures: none   Consultations: none  Discharge Exam: Vitals:   07/04/21 0800 07/04/21 0802  BP: (!) 159/101   Pulse: 82   Resp: (!) 26   Temp: 98.1 F (36.7 C) 98.1 F (36.7 C)  SpO2: 98%    NAD Denies SI/HI Denies wanting to talk to psych Discussed with friend, Sean Shannon who corroborates story, accidental overdose  General: No acute distress. Cardiovascular: RRR Lungs: unlabored Abdomen: Soft, nontender, nondistended Neurological: Alert and oriented 3. Moves all  extremities 4 . Cranial nerves II through XII grossly intact. Skin: Warm and dry. No rashes or lesions. Extremities: No clubbing or cyanosis. No edema.  Psychiatric: Denies SI/HI. Insight and judgment are appropriate.   Discharge  Instructions   Discharge Instructions     Call MD for:  difficulty breathing, headache or visual disturbances   Complete by: As directed    Call MD for:  extreme fatigue   Complete by: As directed    Call MD for:  hives   Complete by: As directed    Call MD for:  persistant dizziness or light-headedness   Complete by: As directed    Call MD for:  persistant nausea and vomiting   Complete by: As directed    Call MD for:  redness, tenderness, or signs of infection (pain, swelling, redness, odor or green/yellow discharge around incision site)   Complete by: As directed    Call MD for:  severe uncontrolled pain   Complete by: As directed    Call MD for:  temperature >100.4   Complete by: As directed    Diet - low sodium heart healthy   Complete by: As directed    Discharge instructions   Complete by: As directed    You were seen for an accidental overdose.  You were treated with narcan and have improved.  Abstinence is the best way to avoid adverse effects from drug use going forward.  Please follow up with your PCP to consider resources going forward.    I'll send Sean Shannon prescription for narcan nasal spray to use in case of emergency.   It may also be helpful to see Sean Shannon therapist or Sean Shannon counselor.    I've refilled your terazosin per your request.  Please follow up with your PCP for other refills.  Return for new, recurrent, or worsening symptoms.  Please ask your PCP to request records from this hospitalization so they know what was done and what the next steps will be.   Increase activity slowly   Complete by: As directed       Allergies as of 07/04/2021   No Known Allergies      Medication List     TAKE these medications    amLODipine 10 MG tablet Commonly known as: NORVASC Take 10 mg by mouth daily after breakfast.   aspirin 325 MG EC tablet Take 325 mg by mouth every 6 (six) hours as needed for pain.   hydrochlorothiazide 25 MG tablet Commonly known as:  HYDRODIURIL Take 1 tablet (25 mg total) by mouth daily.   HYDROcodone-acetaminophen 5-325 MG tablet Commonly known as: NORCO/VICODIN Take 1 tablet by mouth at bedtime as needed for moderate pain or severe pain.   naloxone 4 MG/0.1ML Liqd nasal spray kit Commonly known as: NARCAN Place 1 spray into the nose as needed (opiate overdose). Call 911.   sertraline 100 MG tablet Commonly known as: ZOLOFT Take 100 mg by mouth daily after breakfast.   terazosin 2 MG capsule Commonly known as: HYTRIN Take 2 capsules (4 mg total) by mouth at bedtime.   THERATEARS OP Place 1 drop into both eyes daily as needed (dry eyes).   traZODone 50 MG tablet Commonly known as: DESYREL Take 50 mg by mouth at bedtime as needed for sleep.   Vitamin D 50 MCG (2000 UT) Caps Take 2,000 Units by mouth daily after breakfast.       No Known Allergies    The results of significant diagnostics from this  hospitalization (including imaging, microbiology, ancillary and laboratory) are listed below for reference.    Significant Diagnostic Studies: DG Chest Portable 1 View  Result Date: 07/03/2021 CLINICAL DATA:  Hypoxia, possible aspiration/overdose. EXAM: PORTABLE CHEST 1 VIEW COMPARISON:  Portable chest 05/11/2017. FINDINGS: Heart size and vasculature are normal with stable mediastinal configuration. Mild aortic atherosclerosis. The lungs are clear. No pleural collection is seen. Osteopenia. IMPRESSION: No active disease. Electronically Signed   By: Telford Nab M.D.   On: 07/03/2021 06:06    Microbiology: Recent Results (from the past 240 hour(s))  MRSA Next Gen by PCR, Nasal     Status: None   Collection Time: 07/03/21  9:50 AM   Specimen: Nasal Mucosa; Nasal Swab  Result Value Ref Range Status   MRSA by PCR Next Gen NOT DETECTED NOT DETECTED Final    Comment: (NOTE) The GeneXpert MRSA Assay (FDA approved for NASAL specimens only), is one component of [facility] comprehensive MRSA colonization  surveillance program. It is not intended to diagnose MRSA infection nor to guide or monitor treatment for MRSA infections. Test performance is not FDA approved in patients less than 33 years old. Performed at [facility], [place] [address] [place], [place] [address]   Resp Panel by RT-PCR (Flu Nilaya Bouie&B, Covid) Nasopharyngeal Swab     Status: None   Collection Time: 07/03/21 11:04 AM   Specimen: Nasopharyngeal Swab; Nasopharyngeal(NP) swabs in vial transport medium  Result Value Ref Range Status   SARS Coronavirus 2 by RT PCR NEGATIVE NEGATIVE Final    Comment: (NOTE) SARS-CoV-2 target nucleic acids are NOT DETECTED.  The SARS-CoV-2 RNA is generally detectable in upper respiratory specimens during the acute phase of infection. The lowest concentration of SARS-CoV-2 viral copies this assay can detect is 138 copies/mL. Anastasios Melander negative result does not preclude SARS-Cov-2 infection and should not be used as the sole basis for treatment or other patient management decisions. Yoneko Talerico negative result may occur with  improper specimen collection/handling, submission of specimen other than nasopharyngeal swab, presence of viral mutation(s) within the areas targeted by this assay, and inadequate number of viral copies(<138 copies/mL). Taevyn Hausen negative result must be combined with clinical observations, patient history, and epidemiological information. The expected result is Negative.  Fact Sheet for Patients:  EntrepreneurPulse.com.au  Fact Sheet for Healthcare Providers:  IncredibleEmployment.be  This test is no t yet approved or cleared by the [place] FDA and  has been authorized for detection and/or diagnosis of SARS-CoV-2 by FDA under an Emergency Use Authorization (EUA). This EUA will remain  in effect (meaning this test can be used) for the duration of the COVID-19 declaration under Section 564(b)(1) of the Act, 21 U.S.C.section 360bbb-3(b)(1),  unless the authorization is terminated  or revoked sooner.       Influenza Harlis Champoux by PCR NEGATIVE NEGATIVE Final   Influenza B by PCR NEGATIVE NEGATIVE Final    Comment: (NOTE) The Xpert Xpress SARS-CoV-2/FLU/RSV plus assay is intended as an aid in the diagnosis of influenza from Nasopharyngeal swab specimens and should not be used as [facility] [place] sole basis for treatment. Nasal washings and aspirates are unacceptable for Xpert Xpress SARS-CoV-2/FLU/RSV testing.  Fact Sheet for Patients: EntrepreneurPulse.com.au  Fact Sheet for Healthcare Providers: IncredibleEmployment.be  This test is not yet approved or cleared by the [place] FDA and has been authorized for detection and/or diagnosis of SARS-CoV-2 by FDA under an Emergency Use Authorization (EUA). This EUA will remain in effect (meaning this test can be used) for the  duration of the COVID-19 declaration under Section 564(b)(1) of the Act, 21 U.S.C. section 360bbb-3(b)(1), unless the authorization is terminated or revoked.  Performed at [facility], [place] [address] [place], [place] [address]      Labs: Basic Metabolic Panel: Recent Labs  Lab 07/03/21 0431 07/04/21 0239  NA 138 135  K 3.8 3.0*  CL 101 98  CO2 31 31  GLUCOSE 177* 139*  BUN 18 15  CREATININE 0.86 0.79  CALCIUM 8.4* 8.4*  MG 2.1  --    Liver Function Tests: Recent Labs  Lab 07/03/21 0431 07/04/21 0239  AST 68* 36  ALT 43 32  ALKPHOS 55 60  BILITOT 0.8 0.6  PROT 6.8 6.3*  ALBUMIN 4.0 3.6   No results for input(s): LIPASE, AMYLASE in the last 168 hours. No results for input(s): AMMONIA in the last 168 hours. CBC: Recent Labs  Lab 07/03/21 0431 07/04/21 0239  WBC 11.0* 10.2  NEUTROABS 8.6*  --   HGB 12.6* 13.3  HCT 37.7* 38.6*  MCV 87.7 85.4  PLT 182 204   Cardiac Enzymes: No results for input(s): CKTOTAL, CKMB, CKMBINDEX, TROPONINI in the last 168 hours. BNP: BNP (last 3  results) No results for input(s): BNP in the last 8760 hours.  ProBNP (last 3 results) No results for input(s): PROBNP in the last 8760 hours.  CBG: Recent Labs  Lab 07/03/21 0807 07/03/21 1219 07/03/21 1711 07/03/21 2133 07/04/21 0726  GLUCAP 115* 132* 148* 185* 110*       Signed:  Fayrene Helper MD.  [facility] 07/04/2021, 9:28 AM

## 2021-07-06 ENCOUNTER — Emergency Department (HOSPITAL_COMMUNITY): Payer: No Typology Code available for payment source

## 2021-07-06 ENCOUNTER — Emergency Department (HOSPITAL_COMMUNITY)
Admission: EM | Admit: 2021-07-06 | Discharge: 2021-07-06 | Disposition: A | Discharge status: Home / Self Care | Payer: No Typology Code available for payment source | Attending: Emergency Medicine | Admitting: Emergency Medicine

## 2021-07-06 ENCOUNTER — Other Ambulatory Visit: Payer: Self-pay

## 2021-07-06 ENCOUNTER — Encounter (HOSPITAL_COMMUNITY): Payer: Self-pay

## 2021-07-06 DIAGNOSIS — Z79899 Other long term (current) drug therapy: Secondary | ICD-10-CM | POA: Insufficient documentation

## 2021-07-06 DIAGNOSIS — T405X1A Poisoning by cocaine, accidental (unintentional), initial encounter: Secondary | ICD-10-CM | POA: Insufficient documentation

## 2021-07-06 DIAGNOSIS — T50901A Poisoning by unspecified drugs, medicaments and biological substances, accidental (unintentional), initial encounter: Secondary | ICD-10-CM

## 2021-07-06 DIAGNOSIS — Z7982 Long term (current) use of aspirin: Secondary | ICD-10-CM | POA: Diagnosis not present

## 2021-07-06 LAB — CBC WITH DIFFERENTIAL/PLATELET
Abs Immature Granulocytes: 0.02 10*3/uL (ref 0.00–0.07)
Basophils Absolute: 0.1 10*3/uL (ref 0.0–0.1)
Basophils Relative: 1 %
Eosinophils Absolute: 0.1 10*3/uL (ref 0.0–0.5)
Eosinophils Relative: 2 %
HCT: 36.7 % — ABNORMAL LOW (ref 39.0–52.0)
Hemoglobin: 12.5 g/dL — ABNORMAL LOW (ref 13.0–17.0)
Immature Granulocytes: 0 %
Lymphocytes Relative: 17 %
Lymphs Abs: 1.3 10*3/uL (ref 0.7–4.0)
MCH: 29 pg (ref 26.0–34.0)
MCHC: 34.1 g/dL (ref 30.0–36.0)
MCV: 85.2 fL (ref 80.0–100.0)
Monocytes Absolute: 0.8 10*3/uL (ref 0.1–1.0)
Monocytes Relative: 11 %
Neutro Abs: 5.5 10*3/uL (ref 1.7–7.7)
Neutrophils Relative %: 69 %
Platelets: 185 10*3/uL (ref 150–400)
RBC: 4.31 MIL/uL (ref 4.22–5.81)
RDW: 12.1 % (ref 11.5–15.5)
WBC: 7.8 10*3/uL (ref 4.0–10.5)
nRBC: 0 % (ref 0.0–0.2)

## 2021-07-06 LAB — BASIC METABOLIC PANEL
Anion gap: 6 (ref 5–15)
BUN: 20 mg/dL (ref 8–23)
CO2: 32 mmol/L (ref 22–32)
Calcium: 8.6 mg/dL — ABNORMAL LOW (ref 8.9–10.3)
Chloride: 96 mmol/L — ABNORMAL LOW (ref 98–111)
Creatinine, Ser: 0.78 mg/dL (ref 0.61–1.24)
GFR, Estimated: >60 mL/min (ref >60)
Glucose, Bld: 121 mg/dL — ABNORMAL HIGH (ref 70–99)
Potassium: 3.9 mmol/L (ref 3.5–5.1)
Sodium: 134 mmol/L — ABNORMAL LOW (ref 135–145)

## 2021-07-06 NOTE — ED Provider Notes (Signed)
[facility] DEPT Provider Note   CSN: [id number] Arrival date & time: 07/06/21  1849     History  Chief Complaint  Patient presents with   Drug Overdose    Sean Shannon is a 66 y.o. male.  65 year old male with prior medical history as detailed below presents for evaluation.  Patient reports that he snorted some drugs that he thought were cocaine.  He then became unresponsive.  EMS reports that he was unresponsive and required bagging with an Ambu bag.  He was given 2 mg of intranasal Narcan with improvement in his mental status and ventilatory status.  Patient is comfortable and alert upon my evaluation.  He is without complaint.  He reports that he was trying to get high cocaine when he became unresponsive.  He reports prior history of overdose requiring Narcan administration.  He is without complaint of chest pain, shortness of breath, nausea, vomiting, or other acute complaint.  The history is provided by the patient and medical records.  Drug Overdose This is a new problem. The current episode started less than 1 hour ago. The problem occurs constantly. The problem has not changed since onset.Pertinent negatives include no chest pain and no abdominal pain. Nothing aggravates the symptoms. Nothing relieves the symptoms.      Home Medications Prior to Admission medications   Medication Sig Start Date End Date Taking? Authorizing Provider  terazosin (HYTRIN) 2 MG capsule Take 2 capsules (4 mg total) by mouth at bedtime. 07/04/21 08/03/21 Yes [name]., MD  amLODipine (NORVASC) 10 MG tablet Take 10 mg by mouth daily after breakfast.    [provider]  aspirin 325 MG EC tablet Take 325 mg by mouth every 6 (six) hours as needed for pain.    [provider]  Carboxymethylcellulose Sodium (THERATEARS OP) Place 1 drop into both eyes daily as needed (dry eyes).    [provider]  Cholecalciferol (VITAMIN D)  2000 units CAPS Take 2,000 Units by mouth daily after breakfast.    [provider]  hydrochlorothiazide (HYDRODIURIL) 25 MG tablet Take 1 tablet (25 mg total) by mouth daily. 09/02/12   Trish Fountain, MD  HYDROcodone-acetaminophen (NORCO/VICODIN) 5-325 MG tablet Take 1 tablet by mouth at bedtime as needed for moderate pain or severe pain. 05/19/20   [name], PA-C  naloxone [facility]) nasal spray 4 mg/0.1 mL Place 1 spray into the nose as needed (opiate overdose). Call 911. 07/04/21   [name]., MD  sertraline (ZOLOFT) 100 MG tablet Take 100 mg by mouth daily after breakfast.    [provider]  traZODone (DESYREL) 50 MG tablet Take 50 mg by mouth at bedtime as needed for sleep. 05/02/21   [provider]      Allergies    Patient has no known allergies.    Review of Systems   Review of Systems  Cardiovascular:  Negative for chest pain.  Gastrointestinal:  Negative for abdominal pain.  All other systems reviewed and are negative.  Physical Exam Updated Vital Signs BP (!) 164/87    Pulse 91    Temp 98 F (36.7 C) (Oral)    Resp 14    SpO2 93%  Physical Exam Vitals and nursing note reviewed.  Constitutional:      General: He is not in acute distress.    Appearance: Normal appearance. He is well-developed.  HENT:     Head: Normocephalic and atraumatic.  Eyes:  Conjunctiva/sclera: Conjunctivae normal.     Pupils: Pupils are equal, round, and reactive to light.  Cardiovascular:     Rate and Rhythm: Normal rate and regular rhythm.     Pulses: Normal pulses.     Heart sounds: Normal heart sounds.  Pulmonary:     Effort: Pulmonary effort is normal. No respiratory distress.     Breath sounds: Normal breath sounds.  Abdominal:     General: There is no distension.     Palpations: Abdomen is soft.     Tenderness: There is no abdominal tenderness.  Musculoskeletal:        General: No deformity. Normal range of motion.      Cervical back: Normal range of motion and neck supple.  Skin:    General: Skin is warm and dry.  Neurological:     General: No focal deficit present.     Mental Status: He is alert and oriented to person, place, and time. Mental status is at baseline.     Cranial Nerves: No cranial nerve deficit.     Sensory: No sensory deficit.     Motor: No weakness.     Coordination: Coordination normal.    ED Results / Procedures / Treatments   Labs (all labs ordered are listed, but only abnormal results are displayed) Labs Reviewed  BASIC METABOLIC PANEL - Abnormal; Notable for the following components:      Result Value   Sodium 134 (*)    Chloride 96 (*)    Glucose, Bld 121 (*)    Calcium 8.6 (*)    All other components within normal limits  CBC WITH DIFFERENTIAL/PLATELET - Abnormal; Notable for the following components:   Hemoglobin 12.5 (*)    HCT 36.7 (*)    All other components within normal limits    EKG EKG Interpretation  Date/Time:  Saturday July 06 2021 19:37:30 EST Ventricular Rate:  81 PR Interval:  173 QRS Duration: 101 QT Interval:  414 QTC Calculation: 481 R Axis:   11 Text Interpretation: Sinus rhythm Probable left atrial enlargement Left ventricular hypertrophy Nonspecific T abnrm, anterolateral leads ST elevation, consider anterior injury Borderline prolonged QT interval Confirmed by Dene Gentry [phone]) on 07/06/2021 7:52:33 PM  Radiology DG Chest Port 1 View  Result Date: 07/06/2021 CLINICAL DATA:  Overdose, initial encounter EXAM: PORTABLE CHEST 1 VIEW COMPARISON:  07/03/2021 FINDINGS: Cardiac shadow is within normal limits. The lungs are well aerated bilaterally. No focal infiltrate or sizable effusion is seen. No bony abnormality is seen. IMPRESSION: No active disease. Electronically Signed   By: [name] M.D.   On: 07/06/2021 19:42    Procedures Procedures    Medications Ordered in ED Medications - No data to display  ED Course/ Medical  Decision Making/ A&P                           Medical Decision Making Amount and/or Complexity of Data Reviewed Labs: ordered. Radiology: ordered.    Medical Screen Complete  This patient presented to the ED with complaint of accidental overdose.  This complaint involves an extensive number of treatment options. The initial differential diagnosis includes, but is not limited to, overdose, metabolic abnormality, pulmonary infection,ards, etc  This presentation is: Acute, Previously Undiagnosed, Uncertain Prognosis, Complicated, Systemic Symptoms, and Threat to Life/Bodily Function  Patient presented after apparent overdose.  Patient reported that he was trying to use cocaine.  He became unresponsible after snorting what  he thought was cocaine.  Patient required administration of Narcan by EMS.  Patient is without complaint and alert and oriented on arrival here in the ED.  Patient did agree to a short period of observation.  Screening labs obtained are without significant abnormality.  Patient was without complaint during his period of observation.  Patient now desires DC.  He is alert and oriented at time of discharge.  He is comfortable.  He is able to ambulate without difficulty.  He does understand the need for close follow-up.  He is strongly advised to abstain from use of illicit drugs in the future.    Co morbidities that complicated the patient's evaluation  History of polysubstance abuse   Additional history obtained:  Additional history obtained from EMS External records from outside sources obtained and reviewed including prior ED visits and prior Inpatient records.    Lab Tests:  I ordered and personally interpreted labs.  The pertinent results include:  cbc bmp   Imaging Studies ordered:  I ordered imaging studies including cxr  I independently visualized and interpreted obtained imaging which showed nad I agree with the radiologist  interpretation.   Cardiac Monitoring:  The patient was maintained on a cardiac monitor.  I personally viewed and interpreted the cardiac monitor which showed an underlying rhythm of: nsr    Problem List / ED Course:  Overdose    Reevaluation:  After the interventions noted above, I reevaluated the patient and found that they have: improved    Disposition:  After consideration of the diagnostic results and the patients response to treatment, I feel that the patent would benefit from close outpatient followup.          Final Clinical Impression(s) / ED Diagnoses Final diagnoses:  Accidental overdose, initial encounter    Rx / DC Orders ED Discharge Orders     None         Valarie Merino, MD 07/06/21 2035

## 2021-07-06 NOTE — ED Triage Notes (Signed)
Patient BIBA for opiate OD. Patient was found unresponsive on scene with RR of 4. Patient had to be bagged and was given 2 mg IN narcan.  Patient Aox4 on arrival to hospital 18g L AC  BP: 176/88 HR: 94 SPO2: 98 on RA CBG: 178

## 2021-07-06 NOTE — Discharge Instructions (Addendum)
Do not use drugs like cocaine or other illicit substances.

## 2021-11-06 ENCOUNTER — Other Ambulatory Visit: Payer: Self-pay

## 2021-11-06 ENCOUNTER — Emergency Department (HOSPITAL_COMMUNITY)
Admission: EM | Admit: 2021-11-06 | Discharge: 2021-11-06 | Disposition: A | Discharge status: Home / Self Care | Payer: No Typology Code available for payment source | Attending: Emergency Medicine | Admitting: Emergency Medicine

## 2021-11-06 DIAGNOSIS — Z7982 Long term (current) use of aspirin: Secondary | ICD-10-CM | POA: Insufficient documentation

## 2021-11-06 DIAGNOSIS — Z79899 Other long term (current) drug therapy: Secondary | ICD-10-CM | POA: Insufficient documentation

## 2021-11-06 DIAGNOSIS — R040 Epistaxis: Secondary | ICD-10-CM | POA: Insufficient documentation

## 2021-11-06 MED ORDER — OXYMETAZOLINE HCL 0.05 % NA SOLN
1.0000 | Freq: Once | NASAL | Status: AC
  Administered 2021-11-06: 1 via NASAL
  Filled 2021-11-06: qty 30

## 2021-11-06 NOTE — ED Provider Notes (Signed)
[place] DEPT Provider Note   CSN: [id number] Arrival date & time: 11/06/21  3762     History  Chief Complaint  Patient presents with   Epistaxis    Sean Shannon is a 66 y.o. male.  66 yo M with a chief complaint of epistaxis.  He felt like this was right-sided occurred yesterday and then occurred overnight.  Seems to have stopped upon arrival.  Denies any obvious trauma to the nose.   Epistaxis     Home Medications Prior to Admission medications   Medication Sig Start Date End Date Taking? Authorizing Provider  amLODipine (NORVASC) 10 MG tablet Take 10 mg by mouth daily after breakfast.    [provider]  aspirin 325 MG EC tablet Take 325 mg by mouth every 6 (six) hours as needed for pain.    [provider]  Carboxymethylcellulose Sodium (THERATEARS OP) Place 1 drop into both eyes daily as needed (dry eyes).    [provider]  Cholecalciferol (VITAMIN D) 2000 units CAPS Take 2,000 Units by mouth daily after breakfast.    [provider]  hydrochlorothiazide (HYDRODIURIL) 25 MG tablet Take 1 tablet (25 mg total) by mouth daily. 09/02/12   Trish Fountain, MD  HYDROcodone-acetaminophen (NORCO/VICODIN) 5-325 MG tablet Take 1 tablet by mouth at bedtime as needed for moderate pain or severe pain. 05/19/20   [name], PA-C  naloxone [facility]) nasal spray 4 mg/0.1 mL Place 1 spray into the nose as needed (opiate overdose). Call 911. 07/04/21   [name]., MD  sertraline (ZOLOFT) 100 MG tablet Take 100 mg by mouth daily after breakfast.    [provider]  terazosin (HYTRIN) 2 MG capsule Take 2 capsules (4 mg total) by mouth at bedtime. 07/04/21 08/03/21  [name]., MD  traZODone (DESYREL) 50 MG tablet Take 50 mg by mouth at bedtime as needed for sleep. 05/02/21   [provider]      Allergies    Patient has no known allergies.    Review of Systems    Review of Systems  HENT:  Positive for nosebleeds.    Physical Exam Updated Vital Signs BP (!) 188/97   Pulse 68   Temp 97.9 F (36.6 C) (Oral)   Resp 18   SpO2 96%  Physical Exam Vitals and nursing note reviewed.  Constitutional:      Appearance: He is well-developed.  HENT:     Head: Normocephalic and atraumatic.     Nose:     Comments: Dried blood noted in both naris without active bleeding. Eyes:     Pupils: Pupils are equal, round, and reactive to light.  Neck:     Vascular: No JVD.  Cardiovascular:     Rate and Rhythm: Normal rate and regular rhythm.     Heart sounds: No murmur heard.   No friction rub. No gallop.  Pulmonary:     Effort: No respiratory distress.     Breath sounds: No wheezing.  Abdominal:     General: There is no distension.     Tenderness: There is no abdominal tenderness. There is no guarding or rebound.  Musculoskeletal:        General: Normal range of motion.     Cervical back: Normal range of motion and neck supple.  Skin:    Coloration: Skin is not pale.     Findings: No rash.  Neurological:     Mental Status: He  is alert and oriented to person, place, and time.  Psychiatric:        Behavior: Behavior normal.    ED Results / Procedures / Treatments   Labs (all labs ordered are listed, but only abnormal results are displayed) Labs Reviewed - No data to display  EKG None  Radiology No results found.  Procedures Procedures    Medications Ordered in ED Medications  oxymetazoline (AFRIN) 0.05 % nasal spray 1 spray (has no administration in time range)    ED Course/ Medical Decision Making/ A&P                           Medical Decision Making Risk OTC drugs.   66 yo M with a chief complaints of epistaxis.  This is been off and on since yesterday.  Seems to have stopped upon my evaluation.  I discussed symptomatic care at home.  Given ENT follow-up if needed.  9:25 AM:  I have discussed the diagnosis/risks/treatment  options with the patient.  Evaluation and diagnostic testing in the emergency department does not suggest an emergent condition requiring admission or immediate intervention beyond what has been performed at this time.  They will follow up with  PCP. We also discussed returning to the ED immediately if new or worsening sx occur. We discussed the sx which are most concerning (e.g., sudden worsening pain, fever, inability to tolerate by mouth) that necessitate immediate return. Medications administered to the patient during their visit and any new prescriptions provided to the patient are listed below.  Medications given during this visit Medications  oxymetazoline (AFRIN) 0.05 % nasal spray 1 spray (has no administration in time range)     The patient appears reasonably screen and/or stabilized for discharge and I doubt any other medical condition or other [facility] requiring further screening, evaluation, or treatment in the ED at this time prior to discharge.          Final Clinical Impression(s) / ED Diagnoses Final diagnoses:  Epistaxis    Rx / DC Orders ED Discharge Orders     None         Deno Etienne, DO 11/06/21 [phone]

## 2021-11-06 NOTE — Discharge Instructions (Signed)
If this happens again please hold pressure for 15 minutes without stopping.  If you try that twice and it does not stop the bleeding then return to the emergency department for evaluation.  Apply a small amount of Vaseline or antibiotic ointment to the tip of your pinky and rub it just on the inside of the nose a couple times a day.  If you want you can get a humidifier for your bedroom at night.  I have given you information to follow-up with the ear nose and throat doctor if this continues to occur off and on.  You can give them a call and set up an appointment in the office.

## 2021-11-06 NOTE — ED Triage Notes (Signed)
Pt reports nose bleed that began last night. Pt reports waking up with his bed coved in blood and his nose bleeding again. Pt dizzy in triage

## 2022-01-17 ENCOUNTER — Encounter (HOSPITAL_COMMUNITY): Payer: Self-pay

## 2022-01-17 ENCOUNTER — Emergency Department (HOSPITAL_COMMUNITY)
Admission: EM | Admit: 2022-01-17 | Discharge: 2022-01-17 | Disposition: A | Discharge status: Home / Self Care | Payer: No Typology Code available for payment source | Attending: Emergency Medicine | Admitting: Emergency Medicine

## 2022-01-17 ENCOUNTER — Other Ambulatory Visit: Payer: Self-pay

## 2022-01-17 DIAGNOSIS — E119 Type 2 diabetes mellitus without complications: Secondary | ICD-10-CM | POA: Insufficient documentation

## 2022-01-17 DIAGNOSIS — W57XXXA Bitten or stung by nonvenomous insect and other nonvenomous arthropods, initial encounter: Secondary | ICD-10-CM | POA: Insufficient documentation

## 2022-01-17 DIAGNOSIS — S50362A Insect bite (nonvenomous) of left elbow, initial encounter: Secondary | ICD-10-CM | POA: Insufficient documentation

## 2022-01-17 DIAGNOSIS — I10 Essential (primary) hypertension: Secondary | ICD-10-CM | POA: Diagnosis not present

## 2022-01-17 DIAGNOSIS — Z79899 Other long term (current) drug therapy: Secondary | ICD-10-CM | POA: Diagnosis not present

## 2022-01-17 MED ORDER — IBUPROFEN 200 MG PO TABS
600.0000 mg | ORAL_TABLET | Freq: Once | ORAL | Status: AC
Start: 2022-01-17 — End: 2022-01-17
  Administered 2022-01-17: 600 mg via ORAL
  Filled 2022-01-17: qty 3

## 2022-01-17 NOTE — Discharge Instructions (Signed)
Note the your work-up today was overall consistent with insect bite.  It is reassuring that you had lack of associated symptoms as well as resolution of swelling with ice and ibuprofen.  Continue to use ice at home as well as take ibuprofen as needed for pain/inflammation.  As we discussed, please do not hesitate to return to the emergency department if the worrisome signs and symptoms we discussed become apparent.

## 2022-01-17 NOTE — ED Provider Notes (Signed)
[place] DEPT Provider Note   CSN: [id number] Arrival date & time: 01/17/22  1359     History  Chief Complaint  Patient presents with   Insect Bite    Sean Shannon is a 66 y.o. male.  HPI   66 year old male presents emergency department with complaints of insect bite.  He states he was cleaning out a shed approximately 1-1 and half hours ago.  He denies any known initial bite but states he noticed swelling on his left arm after finished moving.  He says the area is painful to the touch.  Denies feelings of throat closing in on him, weakness, sensory deficits in affected arm, abdominal pain, nausea, vomiting, fever, known anaphylactic action in the past.  He never directly swallowed blood but states it is most likely cause.  Denies any known trauma afterwards.  Denies use of IV drugs.  He states that area now has decreased significantly and swelling since the initial incident.  Past medical history significant for narcotic overdose, hypertension, hyperlipidemia, multiple substance use, hepatitis C positive, cirrhosis, diabetes mellitus type 2  Home Medications Prior to Admission medications   Medication Sig Start Date End Date Taking? Authorizing Provider  amLODipine (NORVASC) 10 MG tablet Take 10 mg by mouth daily after breakfast.    [provider]  aspirin 325 MG EC tablet Take 325 mg by mouth every 6 (six) hours as needed for pain.    [provider]  Carboxymethylcellulose Sodium (THERATEARS OP) Place 1 drop into both eyes daily as needed (dry eyes).    [provider]  Cholecalciferol (VITAMIN D) 2000 units CAPS Take 2,000 Units by mouth daily after breakfast.    [provider]  hydrochlorothiazide (HYDRODIURIL) 25 MG tablet Take 1 tablet (25 mg total) by mouth daily. 09/02/12   Trish Fountain, MD  HYDROcodone-acetaminophen (NORCO/VICODIN) 5-325 MG tablet Take 1 tablet by mouth at bedtime as needed for  moderate pain or severe pain. 05/19/20   [name], PA-C  naloxone [facility]) nasal spray 4 mg/0.1 mL Place 1 spray into the nose as needed (opiate overdose). Call 911. 07/04/21   [name]., MD  sertraline (ZOLOFT) 100 MG tablet Take 100 mg by mouth daily after breakfast.    [provider]  terazosin (HYTRIN) 2 MG capsule Take 2 capsules (4 mg total) by mouth at bedtime. 07/04/21 08/03/21  [name]., MD  traZODone (DESYREL) 50 MG tablet Take 50 mg by mouth at bedtime as needed for sleep. 05/02/21   [provider]      Allergies    Patient has no known allergies.    Review of Systems   Review of Systems  All other systems reviewed and are negative.   Physical Exam Updated Vital Signs BP (!) 180/97   Pulse 86   Temp 98.7 F (37.1 C) (Oral)   Resp 16   Ht '5\' 11"'$  (1.803 m)   Wt 63.5 kg   SpO2 99%   BMI 19.53 kg/m  Physical Exam Vitals and nursing note reviewed.  Constitutional:      General: He is not in acute distress.    Appearance: He is well-developed.  HENT:     Head: Normocephalic and atraumatic.  Eyes:     Conjunctiva/sclera: Conjunctivae normal.  Cardiovascular:     Rate and Rhythm: Normal rate and regular rhythm.     Heart sounds: No murmur heard. Pulmonary:     Effort: Pulmonary effort  is normal. No respiratory distress.     Breath sounds: Normal breath sounds.  Abdominal:     Palpations: Abdomen is soft.     Tenderness: There is no abdominal tenderness.  Musculoskeletal:        General: No swelling.     Cervical back: Neck supple.     Comments: Patient has full range of motion of bilateral lower extremities.  Muscle strength 5 out of 5.  Patient complaining no sensory deficits distal to injury or cramping sensation.  Radial pulses full and intact bilaterally.  Skin:    General: Skin is warm and dry.     Capillary Refill: Capillary refill takes less than 2 seconds.     Comments: 3 cm x 3 cm swelling noted on  the lateral aspect of patient's left elbow area.  Small potential break in skin noticed at the most distal portion of the area.  Swelling is localized to said region.  No obvious fluctuance or induration palpable.  See media for picture with detail.  No obvious track marks noted.  Neurological:     Mental Status: He is alert.  Psychiatric:        Mood and Affect: Mood normal.     ED Results / Procedures / Treatments   Labs (all labs ordered are listed, but only abnormal results are displayed) Labs Reviewed - No data to display  EKG None  Radiology No results found.  Procedures Procedures    Medications Ordered in ED Medications  ibuprofen (ADVIL) tablet 600 mg (600 mg Oral Given 01/17/22 1644)    ED Course/ Medical Decision Making/ A&P Clinical Course as of 01/17/22 2136  Fri Jan 17, 2022  1400 Reevaluation of the patient after administration of ice and ibuprofen showed complete resolution of swelling and redness in affected area. [CR]    Clinical Course User Index [CR] Wilnette Kales, PA                           Medical Decision Making Risk OTC drugs.   This patient presents to the ED for concern of insect bite, this involves an extensive number of treatment options, and is a complaint that carries with it a high risk of complications and morbidity.  The differential diagnosis includes cellulitis, erysipelas, abscess, IV drug use, anaphylaxis, local skin reaction   Co morbidities that complicate the patient evaluation  See HPI   Additional history obtained:  Additional history obtained from EMR   Lab Tests:  N/a   Imaging Studies ordered:  N/a   Cardiac Monitoring: / EKG:  The patient was maintained on a cardiac monitor.  I personally viewed and interpreted the cardiac monitored which showed an underlying rhythm of: Sinus rhythm   Consultations Obtained:  N/a   Problem List / ED Course / Critical interventions / Medication  management  Insect bite  I ordered medication including ibuprofen for pain/inflammation   Reevaluation of the patient after these medicines showed that the patient resolved I have reviewed the patients home medicines and have made adjustments as needed   Social Determinants of Health:  Polysubstance abuse history   Test / Admission - Considered:  Insect bite Vitals signs significant for hypertension with a blood pressure 180/97.  Recommend close follow-up with PCP regarding elevation of blood pressure.. Otherwise within normal range and stable throughout visit. Laboratory/imaging studies considered but deemed unnecessary secondary to probable localized skin reaction from insect bite given clinical  initial appearance as well as resolution of symptoms with administration of ice as well as Advil.  Recommend at home therapy involving the same.  Close follow-up by primary care provider recommended in 2 to 3 days for reevaluation of patient's symptoms. Treatment plan was discussed at length with patient he acknowledged understanding and was agreeable to said plan. Worrisome signs and symptoms were discussed with the patient, and the patient acknowledged understanding to return to the ED if noticed. Patient was stable upon discharge.         Final Clinical Impression(s) / ED Diagnoses Final diagnoses:  Insect bite of left elbow, initial encounter    Rx / DC Orders ED Discharge Orders     None         Wilnette Kales, [place] 01/17/22 2136    Lajean Saver, MD 01/17/22 2310

## 2022-01-17 NOTE — ED Triage Notes (Signed)
Patient states he had an insect or spider bite while cleaning out his shed and was bitten on the left elbow area approx 30 minutes ago. Patient c/o left elbow pain.

## 2022-05-12 ENCOUNTER — Encounter: Payer: Self-pay | Admitting: Internal Medicine

## 2022-05-13 ENCOUNTER — Other Ambulatory Visit (HOSPITAL_COMMUNITY): Payer: Self-pay | Admitting: Internal Medicine

## 2022-05-13 DIAGNOSIS — C229 Malignant neoplasm of liver, not specified as primary or secondary: Secondary | ICD-10-CM

## 2022-05-28 ENCOUNTER — Ambulatory Visit (HOSPITAL_COMMUNITY)
Admission: RE | Admit: 2022-05-28 | Discharge: 2022-05-28 | Disposition: A | Discharge status: Home / Self Care | Payer: No Typology Code available for payment source | Source: Ambulatory Visit | Attending: Internal Medicine | Admitting: Internal Medicine

## 2022-05-28 DIAGNOSIS — C229 Malignant neoplasm of liver, not specified as primary or secondary: Secondary | ICD-10-CM | POA: Insufficient documentation

## 2022-06-09 DIAGNOSIS — F1120 Opioid dependence, uncomplicated: Secondary | ICD-10-CM | POA: Diagnosis not present

## 2022-06-11 DIAGNOSIS — F1120 Opioid dependence, uncomplicated: Secondary | ICD-10-CM | POA: Diagnosis not present

## 2022-06-16 DIAGNOSIS — F1120 Opioid dependence, uncomplicated: Secondary | ICD-10-CM | POA: Diagnosis not present

## 2022-06-23 DIAGNOSIS — F1120 Opioid dependence, uncomplicated: Secondary | ICD-10-CM | POA: Diagnosis not present

## 2022-06-30 DIAGNOSIS — F1120 Opioid dependence, uncomplicated: Secondary | ICD-10-CM | POA: Diagnosis not present

## 2022-09-03 ENCOUNTER — Emergency Department (HOSPITAL_COMMUNITY)
Admission: EM | Admit: 2022-09-03 | Discharge: 2022-09-03 | Disposition: A | Discharge status: Home / Self Care | Payer: No Typology Code available for payment source | Attending: Emergency Medicine | Admitting: Emergency Medicine

## 2022-09-03 ENCOUNTER — Other Ambulatory Visit: Payer: Self-pay

## 2022-09-03 ENCOUNTER — Encounter (HOSPITAL_COMMUNITY): Payer: Self-pay | Admitting: *Deleted

## 2022-09-03 DIAGNOSIS — H65191 Other acute nonsuppurative otitis media, right ear: Secondary | ICD-10-CM

## 2022-09-03 DIAGNOSIS — H9201 Otalgia, right ear: Secondary | ICD-10-CM | POA: Diagnosis present

## 2022-09-03 DIAGNOSIS — Z7982 Long term (current) use of aspirin: Secondary | ICD-10-CM | POA: Diagnosis not present

## 2022-09-03 MED ORDER — AMOXICILLIN-POT CLAVULANATE 875-125 MG PO TABS: 1.0000 | ORAL_TABLET | Freq: Two times a day (BID) | ORAL | 0 refills | Status: AC

## 2022-09-03 NOTE — ED Provider Notes (Signed)
[place] EMERGENCY DEPARTMENT AT [facility] Provider Note   CSN: [id number] Arrival date & time: 09/03/22  0305     History  Chief Complaint  Patient presents with   Otalgia    Sean Shannon is a 67 y.o. male.  Who presents to the emergency department complaining of ear pain. Initial history was limited due to patient repeatedly falling asleep during interview. Once patient was able to wake up a little more,  he stated that he woke up yesterday morning with some mild ear pain that increased in severity as the day went on. He used olive oil, ear drying oil, and cotton last night without any relief in symptoms. He also complains of tinnitus, mild tension headache, and right jaw pain that started over the past 24 hour. He denies recent illness, fever, weakness, vision changes, difficulty breathing, coughing, numbness/tingling.     Otalgia Associated symptoms: congestion, headaches and tinnitus   Associated symptoms: no abdominal pain, no cough, no ear discharge and no fever        Home Medications Prior to Admission medications   Medication Sig Start Date End Date Taking? Authorizing Provider  amLODipine (NORVASC) 10 MG tablet Take 10 mg by mouth daily after breakfast.    [provider]  aspirin 325 MG EC tablet Take 325 mg by mouth every 6 (six) hours as needed for pain.    [provider]  Carboxymethylcellulose Sodium (THERATEARS OP) Place 1 drop into both eyes daily as needed (dry eyes).    [provider]  Cholecalciferol (VITAMIN D) 2000 units CAPS Take 2,000 Units by mouth daily after breakfast.    [provider]  hydrochlorothiazide (HYDRODIURIL) 25 MG tablet Take 1 tablet (25 mg total) by mouth daily. 09/02/12   Trish Fountain, MD  HYDROcodone-acetaminophen (NORCO/VICODIN) 5-325 MG tablet Take 1 tablet by mouth at bedtime as needed for moderate pain or severe pain. 05/19/20   [name], PA-C  naloxone  [facility]) nasal spray 4 mg/0.1 mL Place 1 spray into the nose as needed (opiate overdose). Call 911. 07/04/21   [name]., MD  sertraline (ZOLOFT) 100 MG tablet Take 100 mg by mouth daily after breakfast.    [provider]  terazosin (HYTRIN) 2 MG capsule Take 2 capsules (4 mg total) by mouth at bedtime. 07/04/21 08/03/21  [name]., MD  traZODone (DESYREL) 50 MG tablet Take 50 mg by mouth at bedtime as needed for sleep. 05/02/21   [provider]      Allergies    Patient has no known allergies.    Review of Systems   Review of Systems  Reason unable to perform ROS: patient repeatedly falling asleep during interview.  Constitutional:  Negative for chills and fever.  HENT:  Positive for congestion, ear pain and tinnitus. Negative for ear discharge.   Eyes:  Negative for discharge.  Respiratory:  Negative for cough, choking and shortness of breath.   Gastrointestinal:  Negative for abdominal pain.  Neurological:  Positive for headaches. Negative for dizziness, weakness and numbness.    Physical Exam Updated Vital Signs BP (!) 220/91 (BP Location: Left Arm)   Pulse 70   Temp 98.5 F (36.9 C)   Resp 18   SpO2 98%  Physical Exam Vitals and nursing note reviewed.  Constitutional:      Appearance: Normal appearance. He is not toxic-appearing.  HENT:     Head: Normocephalic and atraumatic.  Comments: No tragal tenderness. No tenderness to palpation to temporal and mastoid areas. No swelling of face or ear.     Ears:     Comments: Ear canal and TM appear irritated and erythematous. White mucoid substance on posterior side of ear canal. No blisters or fluid seen on TM.    Mouth/Throat:     Mouth: Mucous membranes are moist.     Pharynx: Oropharynx is clear. No oropharyngeal exudate or posterior oropharyngeal erythema.     Comments: No swelling of mouth/throat. No dental abscesses appreciated. Eyes:     Extraocular Movements: Extraocular  movements intact.     Conjunctiva/sclera: Conjunctivae normal.     Pupils: Pupils are equal, round, and reactive to light.  Cardiovascular:     Rate and Rhythm: Normal rate and regular rhythm.  Pulmonary:     Effort: Pulmonary effort is normal.     Breath sounds: Normal breath sounds.  Abdominal:     General: Abdomen is flat. Bowel sounds are normal.     Palpations: Abdomen is soft.  Musculoskeletal:     Comments: Jaw ROM intact.   Skin:    General: Skin is warm and dry.     Findings: No erythema, lesion or rash.  Neurological:     General: No focal deficit present.     Mental Status: He is alert.  Psychiatric:        Mood and Affect: Mood normal.     ED Results / Procedures / Treatments   Labs (all labs ordered are listed, but only abnormal results are displayed) Labs Reviewed - No data to display  EKG None  Radiology No results found.  Procedures Procedures    Medications Ordered in ED Medications - No data to display  ED Course/ Medical Decision Making/ A&P                             Medical Decision Making 8:28 AM I have reviewed all vital signs and nursing notes. Patient resting comfortably in bed and mildly difficult to arouse to voice. He responded well to patting his knee, but falls back asleep within seconds. He was also able to smile and raise both eye brows. He reports using cocaine last night. Per nursing note, patient placed "sweet oil and cotton" in his ears the night prior. This was all the history I was able to obtain from the patient due to him falling asleep repeatedly during the interview.  8:58 AM Patient is now awake and able to participate in interview. He states that he woke up yesterday morning with some mild ear pain that increased in severity as the day went on. He used olive oil, ear drying oil, and cotton last night without any relief in symptoms. He also complains of tinnitus, mild tension headache, and right jaw pain that started over  the past 24 hour. Jaw is not tender to palpation. He denies recent illness, fever, weakness, vision changes, difficulty breathing, coughing, numbness/tingling.   During physical exam, no swelling or tenderness was appreciated of temporal or mastoid areas. No dental abscesses were appreciated during oral exam. These findings along with lack of fever makes me less worried about mastoiditis or other deep space head and neck infections. Pinna of the ear was not tender to palpation, which makes otitis externa less likely. It seems that the infection/irritation is limited to the ear canal and TM. No blisters appreciated on TM. He denies any  drug allergies.  Of note, patient has elevated blood pressure in emergency room in which he states that he is has not taken his HTN medication today.    Risk Prescription drug management.           Final Clinical Impression(s) / ED Diagnoses Final diagnoses:  Other non-recurrent acute nonsuppurative otitis media of right ear    Rx / DC Orders ED Discharge Orders     None         Valente David, [place] 09/03/22 Prosperity, Adam, DO 09/03/22 1418

## 2022-09-03 NOTE — ED Notes (Signed)
Pt crying in pain to R ear, reports feeling like something is crawling in his ear, ear canal remains red

## 2022-09-03 NOTE — ED Triage Notes (Signed)
Pt woke up tonight with ear pain; reports putting sweet oil and cotton in his ear. Eardrum red. Pt denies etoh, admits to cocaine use earlier.  Noncompliance with antihypertensive meds

## 2022-09-06 ENCOUNTER — Emergency Department (HOSPITAL_COMMUNITY): Payer: No Typology Code available for payment source

## 2022-09-06 ENCOUNTER — Emergency Department (HOSPITAL_COMMUNITY)
Admission: EM | Admit: 2022-09-06 | Discharge: 2022-09-06 | Disposition: A | Discharge status: Home / Self Care | Payer: No Typology Code available for payment source | Attending: Emergency Medicine | Admitting: Emergency Medicine

## 2022-09-06 ENCOUNTER — Encounter (HOSPITAL_COMMUNITY): Payer: Self-pay

## 2022-09-06 DIAGNOSIS — Z23 Encounter for immunization: Secondary | ICD-10-CM | POA: Diagnosis not present

## 2022-09-06 DIAGNOSIS — S0121XA Laceration without foreign body of nose, initial encounter: Secondary | ICD-10-CM | POA: Diagnosis not present

## 2022-09-06 DIAGNOSIS — I10 Essential (primary) hypertension: Secondary | ICD-10-CM | POA: Diagnosis not present

## 2022-09-06 DIAGNOSIS — S27321A Contusion of lung, unilateral, initial encounter: Secondary | ICD-10-CM | POA: Diagnosis not present

## 2022-09-06 DIAGNOSIS — Z7982 Long term (current) use of aspirin: Secondary | ICD-10-CM | POA: Insufficient documentation

## 2022-09-06 DIAGNOSIS — S0990XA Unspecified injury of head, initial encounter: Secondary | ICD-10-CM | POA: Diagnosis not present

## 2022-09-06 DIAGNOSIS — R1013 Epigastric pain: Secondary | ICD-10-CM | POA: Diagnosis not present

## 2022-09-06 DIAGNOSIS — S0992XA Unspecified injury of nose, initial encounter: Secondary | ICD-10-CM | POA: Diagnosis present

## 2022-09-06 DIAGNOSIS — F1910 Other psychoactive substance abuse, uncomplicated: Secondary | ICD-10-CM | POA: Insufficient documentation

## 2022-09-06 DIAGNOSIS — M5136 Other intervertebral disc degeneration, lumbar region: Secondary | ICD-10-CM | POA: Diagnosis not present

## 2022-09-06 DIAGNOSIS — K8689 Other specified diseases of pancreas: Secondary | ICD-10-CM

## 2022-09-06 DIAGNOSIS — Y92410 Unspecified street and highway as the place of occurrence of the external cause: Secondary | ICD-10-CM | POA: Insufficient documentation

## 2022-09-06 DIAGNOSIS — M539 Dorsopathy, unspecified: Secondary | ICD-10-CM

## 2022-09-06 DIAGNOSIS — R0781 Pleurodynia: Secondary | ICD-10-CM | POA: Diagnosis not present

## 2022-09-06 LAB — CBC WITH DIFFERENTIAL/PLATELET
Abs Immature Granulocytes: 0.01 10*3/uL (ref 0.00–0.07)
Basophils Absolute: 0.1 10*3/uL (ref 0.0–0.1)
Basophils Relative: 1 %
Eosinophils Absolute: 0.3 10*3/uL (ref 0.0–0.5)
Eosinophils Relative: 5 %
HCT: 36.5 % — ABNORMAL LOW (ref 39.0–52.0)
Hemoglobin: 12.2 g/dL — ABNORMAL LOW (ref 13.0–17.0)
Immature Granulocytes: 0 %
Lymphocytes Relative: 31 %
Lymphs Abs: 1.8 10*3/uL (ref 0.7–4.0)
MCH: 29 pg (ref 26.0–34.0)
MCHC: 33.4 g/dL (ref 30.0–36.0)
MCV: 86.9 fL (ref 80.0–100.0)
Monocytes Absolute: 0.8 10*3/uL (ref 0.1–1.0)
Monocytes Relative: 14 %
Neutro Abs: 2.9 10*3/uL (ref 1.7–7.7)
Neutrophils Relative %: 49 %
Platelets: 179 10*3/uL (ref 150–400)
RBC: 4.2 MIL/uL — ABNORMAL LOW (ref 4.22–5.81)
RDW: 12.7 % (ref 11.5–15.5)
WBC: 5.9 10*3/uL (ref 4.0–10.5)
nRBC: 0 % (ref 0.0–0.2)

## 2022-09-06 LAB — LIPASE, BLOOD: Lipase: 23 U/L (ref 11–51)

## 2022-09-06 LAB — RAPID URINE DRUG SCREEN, HOSP PERFORMED
Amphetamines: NOT DETECTED
Barbiturates: NOT DETECTED
Benzodiazepines: NOT DETECTED
Cocaine: POSITIVE — AB
Opiates: POSITIVE — AB
Tetrahydrocannabinol: POSITIVE — AB

## 2022-09-06 LAB — COMPREHENSIVE METABOLIC PANEL
ALT: 23 U/L (ref 0–44)
AST: 42 U/L — ABNORMAL HIGH (ref 15–41)
Albumin: 3.9 g/dL (ref 3.5–5.0)
Alkaline Phosphatase: 61 U/L (ref 38–126)
Anion gap: 9 (ref 5–15)
BUN: 12 mg/dL (ref 8–23)
CO2: 28 mmol/L (ref 22–32)
Calcium: 8.2 mg/dL — ABNORMAL LOW (ref 8.9–10.3)
Chloride: 94 mmol/L — ABNORMAL LOW (ref 98–111)
Creatinine, Ser: 0.77 mg/dL (ref 0.61–1.24)
GFR, Estimated: >60 mL/min (ref >60)
Glucose, Bld: 179 mg/dL — ABNORMAL HIGH (ref 70–99)
Potassium: 3.7 mmol/L (ref 3.5–5.1)
Sodium: 131 mmol/L — ABNORMAL LOW (ref 135–145)
Total Bilirubin: 0.6 mg/dL (ref 0.3–1.2)
Total Protein: 6.6 g/dL (ref 6.5–8.1)

## 2022-09-06 LAB — URINALYSIS, ROUTINE W REFLEX MICROSCOPIC
Bilirubin Urine: NEGATIVE
Glucose, UA: NEGATIVE mg/dL
Hgb urine dipstick: NEGATIVE
Ketones, ur: NEGATIVE mg/dL
Leukocytes,Ua: NEGATIVE
Nitrite: NEGATIVE
Protein, ur: NEGATIVE mg/dL
Specific Gravity, Urine: 1.014 (ref 1.005–1.030)
pH: 5 (ref 5.0–8.0)

## 2022-09-06 LAB — ETHANOL: Alcohol, Ethyl (B): <10 mg/dL (ref <10)

## 2022-09-06 MED ORDER — IOHEXOL 300 MG/ML  SOLN
100.0000 mL | Freq: Once | INTRAMUSCULAR | Status: AC | PRN
  Administered 2022-09-06: 100 mL via INTRAVENOUS

## 2022-09-06 MED ORDER — TETANUS-DIPHTH-ACELL PERTUSSIS 5-2.5-18.5 LF-MCG/0.5 IM SUSY
0.5000 mL | PREFILLED_SYRINGE | Freq: Once | INTRAMUSCULAR | Status: AC
  Administered 2022-09-06: 0.5 mL via INTRAMUSCULAR
  Filled 2022-09-06: qty 0.5

## 2022-09-06 MED ORDER — IBUPROFEN 600 MG PO TABS: 600.0000 mg | ORAL_TABLET | Freq: Three times a day (TID) | ORAL | 0 refills | Status: AC | PRN
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
## 2022-09-08 ENCOUNTER — Telehealth: Payer: Self-pay

## 2022-09-08 NOTE — Telephone Encounter (Signed)
     Patient  visit on 3/27  at [facility]  Have you been able to follow up with your primary care physician? No    The patient was or was not able to obtain any needed medicine or equipment. Yes   Are there diet recommendations that you are having difficulty following? Na   Patient expresses understanding of discharge instructions and education provided has no other needs at this time.  Yes       Sharra Cayabyab [facility] Health Care Guide, [place] [phone] [address], [place], [place] [address] Phone: [phone] Email: [email or web address]    

## 2022-09-10 ENCOUNTER — Telehealth: Payer: Self-pay

## 2022-09-10 NOTE — Telephone Encounter (Signed)
        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   Telephone encounter attempt :  1st  Voicemail full unable to leave message.   [facility] Resource Care Guide   ??[email or web address]  ?? [id number]   Website: [email or web address]  [email or web address]

## 2022-09-11 ENCOUNTER — Telehealth: Payer: Self-pay

## 2022-09-11 NOTE — Telephone Encounter (Signed)
        Patient  visited [facility] on 09/06/2022  for Marine scientist.   Telephone encounter attempt :  2nd  Voicemail full unable to leave message.   [facility] Resource Care Guide   ??[email or web address]  ?? [id number]   Website: [email or web address]  [email or web address]

## 2022-12-08 ENCOUNTER — Encounter: Payer: Self-pay | Admitting: Emergency Medicine

## 2023-01-21 DIAGNOSIS — Z79899 Other long term (current) drug therapy: Secondary | ICD-10-CM | POA: Diagnosis not present

## 2023-01-21 DIAGNOSIS — Z136 Encounter for screening for cardiovascular disorders: Secondary | ICD-10-CM | POA: Diagnosis not present

## 2023-01-21 DIAGNOSIS — Z114 Encounter for screening for human immunodeficiency virus [HIV]: Secondary | ICD-10-CM | POA: Diagnosis not present

## 2023-01-21 DIAGNOSIS — Z125 Encounter for screening for malignant neoplasm of prostate: Secondary | ICD-10-CM | POA: Diagnosis not present

## 2023-01-21 DIAGNOSIS — F1490 Cocaine use, unspecified, uncomplicated: Secondary | ICD-10-CM | POA: Diagnosis not present

## 2023-01-21 DIAGNOSIS — F1420 Cocaine dependence, uncomplicated: Secondary | ICD-10-CM | POA: Diagnosis not present

## 2023-01-21 DIAGNOSIS — F1190 Opioid use, unspecified, uncomplicated: Secondary | ICD-10-CM | POA: Diagnosis not present

## 2023-01-21 DIAGNOSIS — Z1159 Encounter for screening for other viral diseases: Secondary | ICD-10-CM | POA: Diagnosis not present

## 2023-01-21 DIAGNOSIS — E46 Unspecified protein-calorie malnutrition: Secondary | ICD-10-CM | POA: Diagnosis not present

## 2023-02-06 DIAGNOSIS — I10 Essential (primary) hypertension: Secondary | ICD-10-CM | POA: Diagnosis not present

## 2023-02-06 DIAGNOSIS — Z0001 Encounter for general adult medical examination with abnormal findings: Secondary | ICD-10-CM | POA: Diagnosis not present

## 2023-02-06 DIAGNOSIS — J449 Chronic obstructive pulmonary disease, unspecified: Secondary | ICD-10-CM | POA: Diagnosis not present

## 2023-02-06 DIAGNOSIS — M48061 Spinal stenosis, lumbar region without neurogenic claudication: Secondary | ICD-10-CM | POA: Diagnosis not present

## 2023-02-06 DIAGNOSIS — F1420 Cocaine dependence, uncomplicated: Secondary | ICD-10-CM | POA: Diagnosis not present

## 2023-02-06 DIAGNOSIS — N400 Enlarged prostate without lower urinary tract symptoms: Secondary | ICD-10-CM | POA: Diagnosis not present

## 2023-02-06 DIAGNOSIS — E11630 Type 2 diabetes mellitus with periodontal disease: Secondary | ICD-10-CM | POA: Diagnosis not present

## 2023-02-06 DIAGNOSIS — F1190 Opioid use, unspecified, uncomplicated: Secondary | ICD-10-CM | POA: Diagnosis not present

## 2023-02-06 DIAGNOSIS — D329 Benign neoplasm of meninges, unspecified: Secondary | ICD-10-CM | POA: Diagnosis not present

## 2023-03-01 DIAGNOSIS — Z119 Encounter for screening for infectious and parasitic diseases, unspecified: Secondary | ICD-10-CM | POA: Diagnosis not present

## 2023-09-30 DIAGNOSIS — Z79899 Other long term (current) drug therapy: Secondary | ICD-10-CM | POA: Diagnosis not present

## 2023-09-30 DIAGNOSIS — E559 Vitamin D deficiency, unspecified: Secondary | ICD-10-CM | POA: Diagnosis not present

## 2023-09-30 DIAGNOSIS — Z1159 Encounter for screening for other viral diseases: Secondary | ICD-10-CM | POA: Diagnosis not present

## 2024-01-27 DIAGNOSIS — J439 Emphysema, unspecified: Secondary | ICD-10-CM | POA: Diagnosis not present

## 2024-01-27 DIAGNOSIS — J479 Bronchiectasis, uncomplicated: Secondary | ICD-10-CM | POA: Diagnosis not present

## 2024-01-27 DIAGNOSIS — E559 Vitamin D deficiency, unspecified: Secondary | ICD-10-CM | POA: Diagnosis not present

## 2024-01-27 DIAGNOSIS — E785 Hyperlipidemia, unspecified: Secondary | ICD-10-CM | POA: Diagnosis not present

## 2024-01-27 DIAGNOSIS — G4700 Insomnia, unspecified: Secondary | ICD-10-CM | POA: Diagnosis not present

## 2024-01-27 DIAGNOSIS — I509 Heart failure, unspecified: Secondary | ICD-10-CM | POA: Diagnosis not present

## 2024-01-27 DIAGNOSIS — Z79899 Other long term (current) drug therapy: Secondary | ICD-10-CM | POA: Diagnosis not present

## 2024-01-27 DIAGNOSIS — E11630 Type 2 diabetes mellitus with periodontal disease: Secondary | ICD-10-CM | POA: Diagnosis not present

## 2024-01-27 DIAGNOSIS — Z0001 Encounter for general adult medical examination with abnormal findings: Secondary | ICD-10-CM | POA: Diagnosis not present

## 2024-01-27 DIAGNOSIS — Z1159 Encounter for screening for other viral diseases: Secondary | ICD-10-CM | POA: Diagnosis not present

## 2024-01-27 DIAGNOSIS — I110 Hypertensive heart disease with heart failure: Secondary | ICD-10-CM | POA: Diagnosis not present
